# Patient Record
Sex: Male | Born: 1970
Health system: Southern US, Community
[De-identification: ages and names within clinical notes are randomized; demographics above are authoritative.]

## PROBLEM LIST (undated history)

## (undated) DIAGNOSIS — M7710 Lateral epicondylitis, unspecified elbow: Secondary | ICD-10-CM

## (undated) DIAGNOSIS — L309 Dermatitis, unspecified: Secondary | ICD-10-CM

## (undated) DIAGNOSIS — I10 Essential (primary) hypertension: Secondary | ICD-10-CM

## (undated) DIAGNOSIS — E785 Hyperlipidemia, unspecified: Secondary | ICD-10-CM

## (undated) HISTORY — DX: Essential (primary) hypertension: I10

## (undated) HISTORY — PX: WISDOM TOOTH EXTRACTION: SHX21

## (undated) HISTORY — DX: Dermatitis, unspecified: L30.9

## (undated) HISTORY — PX: ROTATOR CUFF REPAIR: SHX139

## (undated) HISTORY — DX: Hyperlipidemia, unspecified: E78.5

## (undated) HISTORY — DX: Lateral epicondylitis, unspecified elbow: M77.10

---

## 2008-11-22 HISTORY — PX: GANGLION CYST EXCISION: SHX1691

## 2011-07-31 ENCOUNTER — Ambulatory Visit (INDEPENDENT_AMBULATORY_CARE_PROVIDER_SITE_OTHER): Payer: 59

## 2011-07-31 DIAGNOSIS — Z23 Encounter for immunization: Secondary | ICD-10-CM

## 2012-06-10 ENCOUNTER — Other Ambulatory Visit: Payer: Self-pay | Admitting: Internal Medicine

## 2012-06-10 NOTE — Telephone Encounter (Signed)
Refill on lisinopril HCTZ 20-12.5 mg. Call into [facility] [address].

## 2012-06-11 MED ORDER — LISINOPRIL-HYDROCHLOROTHIAZIDE 20-12.5 MG PO TABS: 1.0000 | ORAL_TABLET | Freq: Every day | ORAL | Status: DC

## 2012-06-11 NOTE — Telephone Encounter (Signed)
Refilled patients lisinopril per Dr. Lorin Picket

## 2012-06-11 NOTE — Telephone Encounter (Signed)
2nd refill request [facility] [address] Lisinopril hctz 20/12.5mg  tablets  Appointment 2/6

## 2012-06-28 ENCOUNTER — Telehealth: Payer: Self-pay | Admitting: General Practice

## 2012-06-28 MED ORDER — TESTOSTERONE 50 MG/5GM (1%) TD GEL: 5.0000 g | Freq: Every day | TRANSDERMAL | Status: DC

## 2012-06-28 NOTE — Telephone Encounter (Signed)
Ok to refill androgel x 3 months.  Will need to call pharmacy and get exact rx.

## 2012-06-28 NOTE — Telephone Encounter (Signed)
Called prescription in to pharmacy 

## 2012-06-28 NOTE — Telephone Encounter (Signed)
Pt needs androgel 1% at 50mg  refilled. Pt uses [facility] on [address], [place].

## 2012-08-26 ENCOUNTER — Ambulatory Visit: Payer: Self-pay | Admitting: Internal Medicine

## 2012-09-23 ENCOUNTER — Encounter: Payer: Self-pay | Admitting: Internal Medicine

## 2012-09-23 ENCOUNTER — Ambulatory Visit (INDEPENDENT_AMBULATORY_CARE_PROVIDER_SITE_OTHER): Payer: BC Managed Care – PPO | Admitting: Internal Medicine

## 2012-09-23 VITALS — BP 120/60 | HR 73 | Temp 98.4°F | Ht 73.0 in | Wt 183.5 lb

## 2012-09-23 DIAGNOSIS — E291 Testicular hypofunction: Secondary | ICD-10-CM

## 2012-09-23 DIAGNOSIS — I10 Essential (primary) hypertension: Secondary | ICD-10-CM

## 2012-09-23 DIAGNOSIS — E7800 Pure hypercholesterolemia, unspecified: Secondary | ICD-10-CM

## 2012-09-23 DIAGNOSIS — Z720 Tobacco use: Secondary | ICD-10-CM

## 2012-09-23 DIAGNOSIS — D649 Anemia, unspecified: Secondary | ICD-10-CM

## 2012-09-23 DIAGNOSIS — R7989 Other specified abnormal findings of blood chemistry: Secondary | ICD-10-CM

## 2012-09-23 DIAGNOSIS — F17200 Nicotine dependence, unspecified, uncomplicated: Secondary | ICD-10-CM

## 2012-09-23 LAB — CBC WITH DIFFERENTIAL/PLATELET
Basophils Absolute: 0.1 10*3/uL (ref 0.0–0.1)
Eosinophils Absolute: 0.1 10*3/uL (ref 0.0–0.7)
HCT: 40.1 % (ref 39.0–52.0)
Lymphs Abs: 2.3 10*3/uL (ref 0.7–4.0)
MCHC: 34.1 g/dL (ref 30.0–36.0)
MCV: 89.3 fl (ref 78.0–100.0)
Monocytes Absolute: 0.8 10*3/uL (ref 0.1–1.0)
Platelets: 322 10*3/uL (ref 150.0–400.0)
RDW: 14.7 % — ABNORMAL HIGH (ref 11.5–14.6)

## 2012-09-23 LAB — HEPATIC FUNCTION PANEL
ALT: 14 U/L (ref 0–53)
AST: 18 U/L (ref 0–37)
Albumin: 4.2 g/dL (ref 3.5–5.2)
Alkaline Phosphatase: 68 U/L (ref 39–117)

## 2012-09-23 LAB — BASIC METABOLIC PANEL
BUN: 13 mg/dL (ref 6–23)
Chloride: 100 mEq/L (ref 96–112)
Glucose, Bld: 75 mg/dL (ref 70–99)
Potassium: 4.8 mEq/L (ref 3.5–5.1)
Sodium: 136 mEq/L (ref 135–145)

## 2012-09-23 LAB — LIPID PANEL: Cholesterol: 190 mg/dL (ref 0–200)

## 2012-09-23 LAB — PSA: PSA: 1.32 ng/mL (ref 0.10–4.00)

## 2012-09-25 ENCOUNTER — Encounter: Payer: Self-pay | Admitting: Internal Medicine

## 2012-09-25 DIAGNOSIS — E7800 Pure hypercholesterolemia, unspecified: Secondary | ICD-10-CM | POA: Insufficient documentation

## 2012-09-25 DIAGNOSIS — R7989 Other specified abnormal findings of blood chemistry: Secondary | ICD-10-CM | POA: Insufficient documentation

## 2012-09-25 DIAGNOSIS — I10 Essential (primary) hypertension: Secondary | ICD-10-CM | POA: Insufficient documentation

## 2012-09-25 DIAGNOSIS — Z720 Tobacco use: Secondary | ICD-10-CM | POA: Insufficient documentation

## 2012-09-25 DIAGNOSIS — Z862 Personal history of diseases of the blood and blood-forming organs and certain disorders involving the immune mechanism: Secondary | ICD-10-CM | POA: Insufficient documentation

## 2012-09-25 NOTE — Assessment & Plan Note (Signed)
Remains on androgel.  Check psa today.

## 2012-09-25 NOTE — Assessment & Plan Note (Signed)
Discussed the need for smoking cessation.  Discussed treatment options.

## 2012-09-25 NOTE — Progress Notes (Signed)
  Subjective:    Patient ID: Alejandro Ayers, male    DOB: 1971/05/20, 42 y.o.   MRN: [id number]  HPI 42 year old male with past history of hypertension and hypercholesterolemia who comes in today for a scheduled follow up.  He states he is doing well.  Going to school and working.  New job.  Enjoying his new job.  Works a lot of hours.  Tired, but relates it to working and going to school.  No chest pain or tightness.  Breathing stable.  Continues to smoke.  Discussed the need to quit.    Past Medical History  Diagnosis Date  . Hypertension   . Hyperlipidemia   . Eczema     Current Outpatient Prescriptions on File Prior to Visit  Medication Sig Dispense Refill  . lisinopril-hydrochlorothiazide (ZESTORETIC) 20-12.5 MG per tablet Take 1 tablet by mouth daily.  90 tablet  3  . testosterone (ANDROGEL) 50 MG/5GM GEL Place 5 g onto the skin daily.  5 g  3   No current facility-administered medications on file prior to visit.    Review of Systems Patient denies any headache, lightheadedness or dizziness. No sinus or allergy symptom.   No chest pain, tightness or palpitations.  No increased shortness of breath, cough or congestion.  No nausea or vomiting.  No acid reflux.  No abdominal pain or cramping.  No bowel change, such as diarrhea, constipation, BRBPR or melana.  No urine change.        Objective:   Physical Exam Filed Vitals:   09/23/12 1116  BP: 120/60  Pulse: 73  Temp: 98.4 F (36.6 C)   42 year old male in no acute distress.  HEENT:  Nares - clear.  Oropharynx - without lesions. NECK:  Supple.  Nontender.  No audible carotid bruit.  HEART:  Appears to be regular.   LUNGS:  No crackles or wheezing audible.  Respirations even and unlabored.   RADIAL PULSE:  Equal bilaterally.  ABDOMEN:  Soft.  Nontender.  Bowel sounds present and normal.  No audible abdominal bruit.   EXTREMITIES:  No increased edema present.  DP pulses palpable and equal bilaterally.          Assessment  & Plan:  FATIGUE.  Probably related to working and going to school.  Will check cbc,met c and tsh if not checked recently.    CARDIOVASCULAR.  Asymptomatic.  Continue risk factor modification.   HEALTH MAINTENANCE.  Physical 10/06/11.  Check psa today.  Follow cholesterol.

## 2012-09-25 NOTE — Assessment & Plan Note (Signed)
Continue on pravastatin.  Check lipid panel and liver function.  Low cholesterol diet and exercise.

## 2012-09-25 NOTE — Assessment & Plan Note (Signed)
Follow cbc.  

## 2012-09-25 NOTE — Assessment & Plan Note (Signed)
Blood pressure is under good control. Same medication regimen.  Check metabolic panel.  

## 2012-09-27 ENCOUNTER — Ambulatory Visit: Payer: Self-pay | Admitting: Internal Medicine

## 2012-09-29 NOTE — Addendum Note (Signed)
Addended by: Marlene Lard on: 09/29/2012 02:50 PM   Modules accepted: Orders

## 2012-11-02 ENCOUNTER — Encounter: Payer: Self-pay | Admitting: Internal Medicine

## 2012-11-03 ENCOUNTER — Telehealth: Payer: Self-pay | Admitting: Internal Medicine

## 2012-11-03 MED ORDER — PRAVASTATIN SODIUM 20 MG PO TABS: 20.0000 mg | ORAL_TABLET | Freq: Every day | ORAL | Status: DC

## 2012-11-03 NOTE — Telephone Encounter (Signed)
rx sent if for pravastatin 20mg  (#90 with one refill)

## 2012-11-23 ENCOUNTER — Telehealth: Payer: Self-pay | Admitting: Internal Medicine

## 2012-11-23 ENCOUNTER — Ambulatory Visit: Payer: Self-pay | Admitting: Internal Medicine

## 2012-11-23 NOTE — Telephone Encounter (Signed)
Pt came in this morning for his appointment at 8am.  This appointment had been canceled 08/25/12 @ 8:28 am  With reason provider.  He has an appointment 7/21 @ 8:30 for cpx.   Alejandro Ayers Alejandro Ayers wife  Called very upset.  She wanted to know the exact reason why this appointment was canceled. I was unable to give her a reason why his appointment had been cancel  She stated you called her personally to give her this appointment date and time.  Pt spouse is not very happy with the service she is receiving here.  She stated it took her 2 weeks to get a rx refill the last time.

## 2012-11-23 NOTE — Telephone Encounter (Signed)
Called and left message on pts cell phone regarding her concern.  Explained to her that I would try to work him in somewhere else if the 7/14 appt was not acceptable.  Instructed her to check his schedule and have one of them call back with dates.

## 2012-11-24 ENCOUNTER — Telehealth: Payer: Self-pay | Admitting: Internal Medicine

## 2012-11-24 NOTE — Telephone Encounter (Signed)
I can see him on 11/29/12 at 9:30 for a physical.  Please let them know.  Thanks.

## 2012-11-24 NOTE — Telephone Encounter (Signed)
Inetta Fermo is traveling this week and unavailable.  Please contact Billyjack directly.   States unless you can work him in between now and 5/13 between 8am-12pm he will not be able to come.  States she appreciates you calling her.

## 2012-11-25 NOTE — Telephone Encounter (Signed)
Left message for pt to call office  Please confirm 5/12 appointment with pt

## 2012-11-25 NOTE — Telephone Encounter (Signed)
Pt aware of appointment 

## 2012-11-29 ENCOUNTER — Encounter: Payer: Self-pay | Admitting: Internal Medicine

## 2012-11-29 ENCOUNTER — Ambulatory Visit (INDEPENDENT_AMBULATORY_CARE_PROVIDER_SITE_OTHER): Payer: BC Managed Care – PPO | Admitting: Internal Medicine

## 2012-11-29 VITALS — BP 104/70 | HR 72 | Temp 98.1°F | Ht 72.0 in | Wt 176.5 lb

## 2012-11-29 DIAGNOSIS — D649 Anemia, unspecified: Secondary | ICD-10-CM

## 2012-11-29 DIAGNOSIS — I10 Essential (primary) hypertension: Secondary | ICD-10-CM

## 2012-11-29 DIAGNOSIS — F17200 Nicotine dependence, unspecified, uncomplicated: Secondary | ICD-10-CM

## 2012-11-29 DIAGNOSIS — E291 Testicular hypofunction: Secondary | ICD-10-CM

## 2012-11-29 DIAGNOSIS — R7989 Other specified abnormal findings of blood chemistry: Secondary | ICD-10-CM

## 2012-11-29 DIAGNOSIS — E7800 Pure hypercholesterolemia, unspecified: Secondary | ICD-10-CM

## 2012-11-29 DIAGNOSIS — Z720 Tobacco use: Secondary | ICD-10-CM

## 2012-11-29 NOTE — Progress Notes (Signed)
  Subjective:    Patient ID: Alejandro Ayers, male    DOB: 1970/11/22, 42 y.o.   MRN: [id number]  HPI 42 year old male with past history of hypertension and hypercholesterolemia who comes in today to follow up on these issue as well as for a complete physical exam.  He states he is doing well.  Going to school and working.  New job.  Enjoying his new job.  Works a lot of hours.  Tired, but relates it to working and going to school. Feels good.  Enjoying what he does.   No chest pain or tightness.  Breathing stable.  Continues to smoke.  Discussed the need to quit.  Has cut down some    Past Medical History  Diagnosis Date  . Hypertension   . Hyperlipidemia   . Eczema     Current Outpatient Prescriptions on File Prior to Visit  Medication Sig Dispense Refill  . lisinopril-hydrochlorothiazide (ZESTORETIC) 20-12.5 MG per tablet Take 1 tablet by mouth daily.  90 tablet  3  . pravastatin (PRAVACHOL) 20 MG tablet Take 1 tablet (20 mg total) by mouth daily.  90 tablet  1  . testosterone (ANDROGEL) 50 MG/5GM GEL Place 5 g onto the skin daily.  5 g  3   No current facility-administered medications on file prior to visit.    Review of Systems Patient denies any headache, lightheadedness or dizziness. No sinus or allergy symptom.   No chest pain, tightness or palpitations.  No increased shortness of breath, cough or congestion.  No nausea or vomiting.  No acid reflux.  No abdominal pain or cramping.  No bowel change, such as diarrhea, constipation, BRBPR or melana.  No urine change.        Objective:   Physical Exam  Filed Vitals:   11/29/12 0952  BP: 104/70  Pulse: 72  Temp: 98.1 F (36.7 C)   Blood pressure recheck:  118/74, pulse 88  42 year old male in no acute distress.  HEENT:  Nares - clear.  Oropharynx - without lesions. NECK:  Supple.  Nontender.  No audible carotid bruit.  HEART:  Appears to be regular.   LUNGS:  No crackles or wheezing audible.  Respirations even and  unlabored.   RADIAL PULSE:  Equal bilaterally.  ABDOMEN:  Soft.  Nontender.  Bowel sounds present and normal.  No audible abdominal bruit.  GU:  Normal descended testicles.  No palpable testicular nodules.   RECTAL:  Could not appreciate any palpable prostate nodules.  Heme negative.   EXTREMITIES:  No increased edema present.  DP pulses palpable and equal bilaterally.          Assessment & Plan:  CARDIOVASCULAR.  Asymptomatic.  Continue risk factor modification.   HEALTH MAINTENANCE.  Physical today.  PSA 09/23/12 - 1.32.    Follow cholesterol.

## 2012-11-29 NOTE — Assessment & Plan Note (Signed)
Blood pressure is under good control. Same medication regimen.  Follow metabolic panel.    

## 2012-11-29 NOTE — Assessment & Plan Note (Signed)
Follow cbc.  

## 2012-11-29 NOTE — Assessment & Plan Note (Signed)
Remains on androgel.  PSA as outlined.

## 2012-11-29 NOTE — Assessment & Plan Note (Signed)
Continue on pravastatin.  On 20mg  q day now.  Follow lipid panel and liver function.  Low cholesterol diet and exercise.

## 2012-11-29 NOTE — Assessment & Plan Note (Signed)
Discussed the need for smoking cessation.  Discussed treatment options.

## 2013-01-31 ENCOUNTER — Other Ambulatory Visit: Payer: BC Managed Care – PPO

## 2013-01-31 ENCOUNTER — Other Ambulatory Visit (INDEPENDENT_AMBULATORY_CARE_PROVIDER_SITE_OTHER): Payer: BC Managed Care – PPO

## 2013-01-31 DIAGNOSIS — E7800 Pure hypercholesterolemia, unspecified: Secondary | ICD-10-CM

## 2013-01-31 DIAGNOSIS — I10 Essential (primary) hypertension: Secondary | ICD-10-CM

## 2013-01-31 LAB — HEPATIC FUNCTION PANEL
ALT: 17 U/L (ref 0–53)
AST: 16 U/L (ref 0–37)
Albumin: 4.7 g/dL (ref 3.5–5.2)
Alkaline Phosphatase: 73 U/L (ref 39–117)
Bilirubin, Direct: 0.1 mg/dL (ref 0.0–0.3)
Total Bilirubin: 0.5 mg/dL (ref 0.3–1.2)
Total Protein: 7.5 g/dL (ref 6.0–8.3)

## 2013-01-31 LAB — BASIC METABOLIC PANEL
BUN: 22 mg/dL (ref 6–23)
Chloride: 105 mEq/L (ref 96–112)
GFR: 87.13 mL/min (ref >60.00)
Potassium: 4.6 mEq/L (ref 3.5–5.1)
Sodium: 140 mEq/L (ref 135–145)

## 2013-01-31 LAB — LIPID PANEL
Cholesterol: 181 mg/dL (ref 0–200)
HDL: 38 mg/dL — ABNORMAL LOW (ref >39.00)
LDL Cholesterol: 134 mg/dL — ABNORMAL HIGH (ref 0–99)
VLDL: 9.4 mg/dL (ref 0.0–40.0)

## 2013-02-02 ENCOUNTER — Encounter: Payer: Self-pay | Admitting: Internal Medicine

## 2013-02-04 ENCOUNTER — Encounter: Payer: Self-pay | Admitting: *Deleted

## 2013-02-07 ENCOUNTER — Encounter: Payer: BC Managed Care – PPO | Admitting: Internal Medicine

## 2013-05-13 ENCOUNTER — Other Ambulatory Visit: Payer: Self-pay | Admitting: Internal Medicine

## 2013-05-26 ENCOUNTER — Other Ambulatory Visit: Payer: Self-pay

## 2013-06-02 ENCOUNTER — Ambulatory Visit: Payer: BC Managed Care – PPO | Admitting: Internal Medicine

## 2013-07-04 ENCOUNTER — Encounter: Payer: Self-pay | Admitting: Internal Medicine

## 2013-07-04 ENCOUNTER — Other Ambulatory Visit: Payer: Self-pay | Admitting: Internal Medicine

## 2013-07-04 ENCOUNTER — Ambulatory Visit (INDEPENDENT_AMBULATORY_CARE_PROVIDER_SITE_OTHER): Payer: BC Managed Care – PPO | Admitting: Internal Medicine

## 2013-07-04 VITALS — BP 130/80 | HR 82 | Temp 98.1°F | Ht 72.0 in

## 2013-07-04 DIAGNOSIS — F17200 Nicotine dependence, unspecified, uncomplicated: Secondary | ICD-10-CM

## 2013-07-04 DIAGNOSIS — D649 Anemia, unspecified: Secondary | ICD-10-CM

## 2013-07-04 DIAGNOSIS — R7989 Other specified abnormal findings of blood chemistry: Secondary | ICD-10-CM

## 2013-07-04 DIAGNOSIS — E7800 Pure hypercholesterolemia, unspecified: Secondary | ICD-10-CM

## 2013-07-04 DIAGNOSIS — I10 Essential (primary) hypertension: Secondary | ICD-10-CM

## 2013-07-04 DIAGNOSIS — E291 Testicular hypofunction: Secondary | ICD-10-CM

## 2013-07-04 DIAGNOSIS — Z720 Tobacco use: Secondary | ICD-10-CM

## 2013-07-04 NOTE — Progress Notes (Signed)
Orders placed for labs

## 2013-07-04 NOTE — Assessment & Plan Note (Signed)
Follow cbc.  

## 2013-07-04 NOTE — Assessment & Plan Note (Signed)
Discussed the need for smoking cessation.  Wants to retry chantix.  Prescription given .  Will get him back in soon to reassess.

## 2013-07-04 NOTE — Assessment & Plan Note (Signed)
Remains on androgel.  PSA as outlined.  Need to recheck psa.  Only uses androgel occasionally.

## 2013-07-04 NOTE — Progress Notes (Signed)
Pre-visit discussion using our clinic review tool. No additional management support is needed unless otherwise documented below in the visit note.  

## 2013-07-04 NOTE — Assessment & Plan Note (Signed)
Continue on pravastatin.  On 20mg  q day now.  Follow lipid panel and liver function.  Low cholesterol diet and exercise.

## 2013-07-04 NOTE — Assessment & Plan Note (Signed)
Blood pressure is under good control. Same medication regimen.  Follow metabolic panel.    

## 2013-07-04 NOTE — Progress Notes (Signed)
  Subjective:    Patient ID: Alejandro Ayers, male    DOB: July 22, 1970, 42 y.o.   MRN: [id number]  HPI 42 year old male with past history of hypertension and hypercholesterolemia who comes in today for a scheduled follow up.   He states he is doing well.  Going to school and working.  Enjoying his job.  Works a lot of hours.  Tired, but relates it to working and going to school.  Fatigue is better.  Feels good.  Enjoying what he does.   No chest pain or tightness.  Breathing stable.  Continues to smoke.  Discussed the need to quit.  Has cut down some.  Ready to start chantix.  Has taken previously and tolerated well.     Past Medical History  Diagnosis Date  . Hypertension   . Hyperlipidemia   . Eczema     Current Outpatient Prescriptions on File Prior to Visit  Medication Sig Dispense Refill  . lisinopril-hydrochlorothiazide (ZESTORETIC) 20-12.5 MG per tablet Take 1 tablet by mouth daily.  90 tablet  3  . pravastatin (PRAVACHOL) 20 MG tablet TAKE 1 TABLET BY MOUTH EVERY DAY  90 tablet  1  . testosterone (ANDROGEL) 50 MG/5GM GEL Place 5 g onto the skin daily.  5 g  3   No current facility-administered medications on file prior to visit.    Review of Systems Patient denies any headache, lightheadedness or dizziness. No sinus or allergy symptom.   No chest pain, tightness or palpitations.  No increased shortness of breath, cough or congestion.  No nausea or vomiting.  No acid reflux.  No abdominal pain or cramping.  No bowel change, such as diarrhea, constipation, BRBPR or melana.  No urine change.  Ready to quit smoking.         Objective:   Physical Exam  Filed Vitals:   07/04/13 0812  BP: 130/80  Pulse: 82  Temp: 98.1 F (36.7 C)   Blood pressure recheck:  112/74, pulse 88  42 year old male in no acute distress.  HEENT:  Nares - clear.  Oropharynx - without lesions. NECK:  Supple.  Nontender.  No audible carotid bruit.  HEART:  Appears to be regular.   LUNGS:  No crackles or  wheezing audible.  Respirations even and unlabored.   RADIAL PULSE:  Equal bilaterally.  ABDOMEN:  Soft.  Nontender.  Bowel sounds present and normal.  No audible abdominal bruit.  EXTREMITIES:  No increased edema present.  DP pulses palpable and equal bilaterally.          Assessment & Plan:  CARDIOVASCULAR.  Asymptomatic.  Continue risk factor modification.   HEALTH MAINTENANCE.  Physical 11/29/12.   PSA 09/23/12 - 1.32.    Follow cholesterol.

## 2013-07-22 ENCOUNTER — Other Ambulatory Visit: Payer: BC Managed Care – PPO

## 2013-07-22 ENCOUNTER — Other Ambulatory Visit (INDEPENDENT_AMBULATORY_CARE_PROVIDER_SITE_OTHER): Payer: BC Managed Care – PPO

## 2013-07-22 DIAGNOSIS — E291 Testicular hypofunction: Secondary | ICD-10-CM

## 2013-07-22 DIAGNOSIS — D649 Anemia, unspecified: Secondary | ICD-10-CM

## 2013-07-22 DIAGNOSIS — I10 Essential (primary) hypertension: Secondary | ICD-10-CM

## 2013-07-22 DIAGNOSIS — E7800 Pure hypercholesterolemia, unspecified: Secondary | ICD-10-CM

## 2013-07-22 DIAGNOSIS — R7989 Other specified abnormal findings of blood chemistry: Secondary | ICD-10-CM

## 2013-07-22 LAB — CBC WITH DIFFERENTIAL/PLATELET
Basophils Absolute: 0.1 10*3/uL (ref 0.0–0.1)
Basophils Relative: 0.7 % (ref 0.0–3.0)
EOS ABS: 0.3 10*3/uL (ref 0.0–0.7)
Eosinophils Relative: 2.7 % (ref 0.0–5.0)
HCT: 41.4 % (ref 39.0–52.0)
Hemoglobin: 14.2 g/dL (ref 13.0–17.0)
Lymphocytes Relative: 28.5 % (ref 12.0–46.0)
Lymphs Abs: 3 10*3/uL (ref 0.7–4.0)
MCHC: 34.2 g/dL (ref 30.0–36.0)
MCV: 92 fl (ref 78.0–100.0)
MONO ABS: 1 10*3/uL (ref 0.1–1.0)
Monocytes Relative: 9.5 % (ref 3.0–12.0)
NEUTROS PCT: 58.6 % (ref 43.0–77.0)
Neutro Abs: 6.1 10*3/uL (ref 1.4–7.7)
Platelets: 310 10*3/uL (ref 150.0–400.0)
RBC: 4.5 Mil/uL (ref 4.22–5.81)
RDW: 14.6 % (ref 11.5–14.6)
WBC: 10.5 10*3/uL (ref 4.5–10.5)

## 2013-07-22 LAB — LIPID PANEL
Cholesterol: 213 mg/dL — ABNORMAL HIGH (ref 0–200)
HDL: 40.3 mg/dL (ref >39.00)
TRIGLYCERIDES: 81 mg/dL (ref 0.0–149.0)
Total CHOL/HDL Ratio: 5
VLDL: 16.2 mg/dL (ref 0.0–40.0)

## 2013-07-22 LAB — HEPATIC FUNCTION PANEL
ALT: 26 U/L (ref 0–53)
AST: 21 U/L (ref 0–37)
Albumin: 4.5 g/dL (ref 3.5–5.2)
Alkaline Phosphatase: 68 U/L (ref 39–117)
BILIRUBIN TOTAL: 0.6 mg/dL (ref 0.3–1.2)
Bilirubin, Direct: 0.1 mg/dL (ref 0.0–0.3)
Total Protein: 6.9 g/dL (ref 6.0–8.3)

## 2013-07-22 LAB — BASIC METABOLIC PANEL
BUN: 22 mg/dL (ref 6–23)
CO2: 29 mEq/L (ref 19–32)
Calcium: 9.2 mg/dL (ref 8.4–10.5)
Chloride: 103 mEq/L (ref 96–112)
Creatinine, Ser: 0.8 mg/dL (ref 0.4–1.5)
GFR: 114.48 mL/min (ref >60.00)
Glucose, Bld: 102 mg/dL — ABNORMAL HIGH (ref 70–99)
POTASSIUM: 4.9 meq/L (ref 3.5–5.1)
Sodium: 137 mEq/L (ref 135–145)

## 2013-07-22 LAB — FERRITIN: Ferritin: 83.6 ng/mL (ref 22.0–322.0)

## 2013-07-22 LAB — TSH: TSH: 1.08 u[IU]/mL (ref 0.35–5.50)

## 2013-07-22 LAB — LDL CHOLESTEROL, DIRECT: Direct LDL: 160.3 mg/dL

## 2013-07-22 LAB — PSA: PSA: 0.55 ng/mL (ref 0.10–4.00)

## 2013-07-24 ENCOUNTER — Encounter: Payer: Self-pay | Admitting: Internal Medicine

## 2013-07-25 MED ORDER — PRAVASTATIN SODIUM 40 MG PO TABS: 40.0000 mg | ORAL_TABLET | Freq: Every day | ORAL | Status: DC

## 2013-07-25 NOTE — Telephone Encounter (Signed)
I changed his pravastatin to 40mg  q day and sent in rx.  Please send him [facility] lipid diet and low cholesterol diet.   Thanks.

## 2013-08-19 ENCOUNTER — Other Ambulatory Visit: Payer: Self-pay | Admitting: Internal Medicine

## 2013-09-06 ENCOUNTER — Ambulatory Visit: Payer: BC Managed Care – PPO | Admitting: Internal Medicine

## 2013-12-21 ENCOUNTER — Other Ambulatory Visit: Payer: Self-pay | Admitting: Internal Medicine

## 2014-01-22 ENCOUNTER — Other Ambulatory Visit: Payer: Self-pay | Admitting: Internal Medicine

## 2014-02-23 ENCOUNTER — Other Ambulatory Visit: Payer: Self-pay | Admitting: Internal Medicine

## 2014-03-04 ENCOUNTER — Other Ambulatory Visit: Payer: Self-pay | Admitting: Internal Medicine

## 2014-03-06 ENCOUNTER — Encounter: Payer: Self-pay | Admitting: Internal Medicine

## 2014-04-01 ENCOUNTER — Other Ambulatory Visit: Payer: Self-pay | Admitting: Internal Medicine

## 2014-04-03 NOTE — Telephone Encounter (Signed)
Sent mychart message on need for appt and labs

## 2014-04-05 NOTE — Telephone Encounter (Signed)
Mailed unread message to pt  

## 2014-04-10 ENCOUNTER — Telehealth: Payer: Self-pay | Admitting: Internal Medicine

## 2014-04-10 NOTE — Telephone Encounter (Signed)
Pt called in and made an appt for November 4th for med refill and was just wondering if needed to be seen any sooner

## 2014-04-11 NOTE — Telephone Encounter (Signed)
Pt notified & appt scheduled. Has enough meds to last until appt date.

## 2014-04-11 NOTE — Telephone Encounter (Signed)
I can see him on 05/02/14 at 11:45 (put in for physical).  Also need to know if he is having any problems and if he has enough medication to get him through to appt.

## 2014-05-02 ENCOUNTER — Telehealth: Payer: Self-pay | Admitting: Internal Medicine

## 2014-05-02 ENCOUNTER — Encounter: Payer: BC Managed Care – PPO | Admitting: Internal Medicine

## 2014-05-02 NOTE — Telephone Encounter (Signed)
Please advise if you prefer that I call him.

## 2014-05-02 NOTE — Telephone Encounter (Signed)
Pt called and stated that he forgot about appt for today. Pt has rescheduled appt. Pt ask that Dr. Nicki Reaper call him.msn

## 2014-05-02 NOTE — Telephone Encounter (Signed)
Pt was rescheduled for 05/24/14 for a 30 minute appt & just wanted to know if he could do his physical on that day also.

## 2014-05-02 NOTE — Telephone Encounter (Signed)
Let him know that I am seeing pts and see if you can get info.  Thanks

## 2014-05-03 NOTE — Telephone Encounter (Signed)
Yes

## 2014-05-03 NOTE — Telephone Encounter (Signed)
Pt.notified

## 2014-05-13 ENCOUNTER — Other Ambulatory Visit: Payer: Self-pay | Admitting: Internal Medicine

## 2014-05-24 ENCOUNTER — Encounter: Payer: Self-pay | Admitting: Internal Medicine

## 2014-05-24 ENCOUNTER — Ambulatory Visit (INDEPENDENT_AMBULATORY_CARE_PROVIDER_SITE_OTHER): Payer: BC Managed Care – PPO | Admitting: Internal Medicine

## 2014-05-24 VITALS — BP 100/60 | HR 83 | Temp 98.3°F | Ht 72.0 in | Wt 183.2 lb

## 2014-05-24 DIAGNOSIS — I10 Essential (primary) hypertension: Secondary | ICD-10-CM

## 2014-05-24 DIAGNOSIS — E291 Testicular hypofunction: Secondary | ICD-10-CM

## 2014-05-24 DIAGNOSIS — D649 Anemia, unspecified: Secondary | ICD-10-CM

## 2014-05-24 DIAGNOSIS — E7800 Pure hypercholesterolemia, unspecified: Secondary | ICD-10-CM

## 2014-05-24 DIAGNOSIS — R7989 Other specified abnormal findings of blood chemistry: Secondary | ICD-10-CM

## 2014-05-24 DIAGNOSIS — Z720 Tobacco use: Secondary | ICD-10-CM

## 2014-05-24 LAB — CBC WITH DIFFERENTIAL/PLATELET
Basophils Absolute: 0 10*3/uL (ref 0.0–0.1)
Basophils Relative: 0.5 % (ref 0.0–3.0)
Eosinophils Absolute: 0.2 10*3/uL (ref 0.0–0.7)
Eosinophils Relative: 2.4 % (ref 0.0–5.0)
HEMATOCRIT: 41.7 % (ref 39.0–52.0)
HEMOGLOBIN: 14.1 g/dL (ref 13.0–17.0)
LYMPHS ABS: 2.7 10*3/uL (ref 0.7–4.0)
Lymphocytes Relative: 28.3 % (ref 12.0–46.0)
MCHC: 33.9 g/dL (ref 30.0–36.0)
MCV: 91.2 fl (ref 78.0–100.0)
MONOS PCT: 10 % (ref 3.0–12.0)
Monocytes Absolute: 1 10*3/uL (ref 0.1–1.0)
Neutro Abs: 5.7 10*3/uL (ref 1.4–7.7)
Neutrophils Relative %: 58.8 % (ref 43.0–77.0)
PLATELETS: 342 10*3/uL (ref 150.0–400.0)
RBC: 4.57 Mil/uL (ref 4.22–5.81)
RDW: 15 % (ref 11.5–15.5)
WBC: 9.6 10*3/uL (ref 4.0–10.5)

## 2014-05-24 LAB — HEPATIC FUNCTION PANEL
ALBUMIN: 4 g/dL (ref 3.5–5.2)
ALK PHOS: 78 U/L (ref 39–117)
ALT: 20 U/L (ref 0–53)
AST: 24 U/L (ref 0–37)
Bilirubin, Direct: 0.1 mg/dL (ref 0.0–0.3)
Total Bilirubin: 0.7 mg/dL (ref 0.2–1.2)
Total Protein: 7.2 g/dL (ref 6.0–8.3)

## 2014-05-24 LAB — BASIC METABOLIC PANEL
BUN: 21 mg/dL (ref 6–23)
CALCIUM: 9.9 mg/dL (ref 8.4–10.5)
CO2: 28 mEq/L (ref 19–32)
CREATININE: 0.9 mg/dL (ref 0.4–1.5)
Chloride: 101 mEq/L (ref 96–112)
GFR: 95.46 mL/min (ref >60.00)
Glucose, Bld: 79 mg/dL (ref 70–99)
Potassium: 4.6 mEq/L (ref 3.5–5.1)
Sodium: 137 mEq/L (ref 135–145)

## 2014-05-24 LAB — LIPID PANEL
Cholesterol: 193 mg/dL (ref 0–200)
HDL: 37.6 mg/dL — ABNORMAL LOW (ref >39.00)
LDL CALC: 141 mg/dL — AB (ref 0–99)
NonHDL: 155.4
Total CHOL/HDL Ratio: 5
Triglycerides: 74 mg/dL (ref 0.0–149.0)
VLDL: 14.8 mg/dL (ref 0.0–40.0)

## 2014-05-24 NOTE — Progress Notes (Signed)
Pre visit review using our clinic review tool, if applicable. No additional management support is needed unless otherwise documented below in the visit note. 

## 2014-05-25 LAB — PSA: PSA: 0.62 ng/mL (ref 0.10–4.00)

## 2014-05-25 LAB — TSH: TSH: 1.25 u[IU]/mL (ref 0.35–4.50)

## 2014-05-26 ENCOUNTER — Encounter: Payer: Self-pay | Admitting: Internal Medicine

## 2014-05-28 ENCOUNTER — Encounter: Payer: Self-pay | Admitting: Internal Medicine

## 2014-05-28 NOTE — Progress Notes (Signed)
  Subjective:    Patient ID: Alejandro Ayers, male    DOB: 25-Jun-1971, 43 y.o.   MRN: [id number]  HPI 43 year old male with past history of hypertension and hypercholesterolemia who comes in today to follow up on these issues as well as for a complete physical exam.  He states he is doing well.  Finished school.  Enjoying his job.  Works a lot of hours.  Tired, but relates it to working.  Recently got a promotion.   Feels good.  Enjoying what he does.   No chest pain or tightness.  Breathing stable.  Continues to smoke.  Discussed the need to quit.  Has cut down some.  Not ready to quit at this time.  Got his flu shot 05/05/14.    Past Medical History  Diagnosis Date  . Hypertension   . Hyperlipidemia   . Eczema     Current Outpatient Prescriptions on File Prior to Visit  Medication Sig Dispense Refill  . lisinopril-hydrochlorothiazide (PRINZIDE,ZESTORETIC) 20-12.5 MG per tablet TAKE 1 TABLET BY MOUTH EVERY DAY 30 tablet 0  . pravastatin (PRAVACHOL) 40 MG tablet TAKE 1 TABLET BY MOUTH EVERY DAY 30 tablet 0   No current facility-administered medications on file prior to visit.    Review of Systems Patient denies any headache, lightheadedness or dizziness. No sinus or allergy symptom.   No chest pain, tightness or palpitations.  No increased shortness of breath, cough or congestion.  No nausea or vomiting.  No acid reflux.  No abdominal pain or cramping.  No bowel change, such as diarrhea, constipation, BRBPR or melana.  No urine change.  Smoking <1ppd.          Objective:   Physical Exam  Filed Vitals:   05/24/14 1105  BP: 100/60  Pulse: 83  Temp: 98.3 F (36.8 C)   Blood pressure recheck:  112/68, pulse 78  42 year old male in no acute distress.  HEENT:  Nares - clear.  Oropharynx - without lesions. NECK:  Supple.  Nontender.  No audible carotid bruit.  HEART:  Appears to be regular.   LUNGS:  No crackles or wheezing audible.  Respirations even and unlabored.   RADIAL PULSE:   Equal bilaterally.  ABDOMEN:  Soft.  Nontender.  Bowel sounds present and normal.  No audible abdominal bruit.  GU:  Normal descended testicles.  No palpable testicular nodules.   RECTAL:  Could not appreciate any palpable prostate nodules.  Heme negative.   EXTREMITIES:  No increased edema present.  DP pulses palpable and equal bilaterally.         Assessment & Plan:  CARDIOVASCULAR.  Asymptomatic.  Continue risk factor modification.   Essential hypertension, benign Blood pressure doing well.  Follow.   - Basic metabolic panel  Low testosterone Off androgel.  Follow.  - PSA  Pure hypercholesterolemia On pravastatin.  Low cholesterol diet and exercise.   - Lipid panel - Hepatic function panel  Anemia, unspecified anemia type - CBC with Differential - TSH  Tobacco abuse Discussed with him today regarding the need to quit.  He is not ready to stop at this point.  Follow.   HEALTH MAINTENANCE.  Physical today.   PSA 07/22/13- .55.    Follow cholesterol.

## 2014-05-29 ENCOUNTER — Other Ambulatory Visit: Payer: Self-pay | Admitting: *Deleted

## 2014-05-29 MED ORDER — ATORVASTATIN CALCIUM 20 MG PO TABS: 20.0000 mg | ORAL_TABLET | Freq: Every day | ORAL | Status: DC

## 2014-06-22 ENCOUNTER — Other Ambulatory Visit: Payer: Self-pay | Admitting: Internal Medicine

## 2014-06-27 ENCOUNTER — Encounter: Payer: BC Managed Care – PPO | Admitting: Internal Medicine

## 2014-07-29 ENCOUNTER — Other Ambulatory Visit: Payer: Self-pay | Admitting: Internal Medicine

## 2014-08-25 ENCOUNTER — Other Ambulatory Visit: Payer: Self-pay | Admitting: *Deleted

## 2014-08-25 MED ORDER — LISINOPRIL-HYDROCHLOROTHIAZIDE 20-12.5 MG PO TABS: 1.0000 | ORAL_TABLET | Freq: Every day | ORAL | Status: DC

## 2014-08-25 MED ORDER — ATORVASTATIN CALCIUM 20 MG PO TABS
20.0000 mg | ORAL_TABLET | Freq: Every day | ORAL | Status: DC
Start: 2014-08-25 — End: 2015-02-18

## 2014-11-22 ENCOUNTER — Ambulatory Visit: Payer: BC Managed Care – PPO | Admitting: Internal Medicine

## 2014-12-07 ENCOUNTER — Ambulatory Visit (INDEPENDENT_AMBULATORY_CARE_PROVIDER_SITE_OTHER): Payer: Managed Care, Other (non HMO) | Admitting: Internal Medicine

## 2014-12-07 ENCOUNTER — Encounter: Payer: Self-pay | Admitting: Internal Medicine

## 2014-12-07 VITALS — BP 110/70 | HR 84 | Temp 98.0°F | Ht 72.0 in | Wt 192.2 lb

## 2014-12-07 DIAGNOSIS — M79605 Pain in left leg: Secondary | ICD-10-CM | POA: Insufficient documentation

## 2014-12-07 DIAGNOSIS — E7800 Pure hypercholesterolemia, unspecified: Secondary | ICD-10-CM

## 2014-12-07 DIAGNOSIS — D649 Anemia, unspecified: Secondary | ICD-10-CM

## 2014-12-07 DIAGNOSIS — Z0000 Encounter for general adult medical examination without abnormal findings: Secondary | ICD-10-CM

## 2014-12-07 DIAGNOSIS — I10 Essential (primary) hypertension: Secondary | ICD-10-CM | POA: Diagnosis not present

## 2014-12-07 DIAGNOSIS — H00019 Hordeolum externum unspecified eye, unspecified eyelid: Secondary | ICD-10-CM

## 2014-12-07 DIAGNOSIS — Z720 Tobacco use: Secondary | ICD-10-CM

## 2014-12-07 NOTE — Progress Notes (Deleted)
   Subjective:  Patient ID: Alejandro Ayers, male    DOB: 23-Jan-1971  Age: 44 y.o. MRN: [id number]  CC: There were no encounter diagnoses.  HPI [name] presents for ***  Outpatient Prescriptions Prior to Visit  Medication Sig Dispense Refill  . atorvastatin (LIPITOR) 20 MG tablet Take 1 tablet (20 mg total) by mouth daily. 90 tablet 1  . lisinopril-hydrochlorothiazide (PRINZIDE,ZESTORETIC) 20-12.5 MG per tablet Take 1 tablet by mouth daily. 90 tablet 1   No facility-administered medications prior to visit.    ROS Review of Systems  Objective:  BP 110/70 mmHg  Pulse 84  Temp(Src) 98 F (36.7 C) (Oral)  Ht 6' (1.829 m)  Wt 192 lb 4 oz (87.204 kg)  BMI 26.07 kg/m2  SpO2 96%  BP Readings from Last 3 Encounters:  12/07/14 110/70  05/24/14 100/60  07/04/13 130/80    Wt Readings from Last 3 Encounters:  12/07/14 192 lb 4 oz (87.204 kg)  05/24/14 183 lb 4 oz (83.122 kg)  11/29/12 176 lb 8 oz (80.06 kg)    Physical Exam  No results found for: HGBA1C  Lab Results  Component Value Date   CREATININE 0.9 05/24/2014   CREATININE 0.8 07/22/2013   CREATININE 1.0 01/31/2013    Lab Results  Component Value Date   WBC 9.6 05/24/2014   HGB 14.1 05/24/2014   HCT 41.7 05/24/2014   PLT 342.0 05/24/2014   GLUCOSE 79 05/24/2014   CHOL 193 05/24/2014   TRIG 74.0 05/24/2014   HDL 37.60* 05/24/2014   LDLDIRECT 160.3 07/22/2013   LDLCALC 141* 05/24/2014   ALT 20 05/24/2014   AST 24 05/24/2014   NA 137 05/24/2014   K 4.6 05/24/2014   CL 101 05/24/2014   CREATININE 0.9 05/24/2014   BUN 21 05/24/2014   CO2 28 05/24/2014   TSH 1.25 05/24/2014   PSA 0.62 05/24/2014    Patient was never admitted.  Assessment & Plan:   Problem List Items Addressed This Visit    None      I am having Mr. Sida maintain his atorvastatin and lisinopril-hydrochlorothiazide.  No orders of the defined types were placed in this encounter.    There are no discontinued  medications.  Follow-up: No Follow-up on file.   Einar Pheasant, MD

## 2014-12-07 NOTE — Progress Notes (Signed)
Pre visit review using our clinic review tool, if applicable. No additional management support is needed unless otherwise documented below in the visit note. 

## 2014-12-18 ENCOUNTER — Encounter: Payer: Self-pay | Admitting: Internal Medicine

## 2014-12-18 DIAGNOSIS — Z0000 Encounter for general adult medical examination without abnormal findings: Secondary | ICD-10-CM | POA: Insufficient documentation

## 2014-12-18 DIAGNOSIS — H00019 Hordeolum externum unspecified eye, unspecified eyelid: Secondary | ICD-10-CM | POA: Insufficient documentation

## 2014-12-18 NOTE — Assessment & Plan Note (Signed)
Follow cbc. Last hgb 14.1.

## 2014-12-18 NOTE — Progress Notes (Signed)
Patient ID: Alejandro Ayers, male   DOB: 1971/07/18, 44 y.o.   MRN: [id number]   Subjective:    Patient ID: Alejandro Ayers, male    DOB: March 01, 1971, 44 y.o.   MRN: [id number]  HPI  Patient here for a scheduled follow up.  Working.  Has finished school.  Woks a lot of hours.  Some fatigue related to this.  Nothing out of the ordinary.  Stays active.  No cardiac symptoms with increased activity or exertion.  Still smoking.  Discussed the need to quit.  Discussed treatment options.  Eating and drinking well.  Bowels stable.  Persistent left leg pain.  Request referral to ortho for evaluation.  Sty present.  Discussed using warm compresses.     Past Medical History  Diagnosis Date  . Hypertension   . Hyperlipidemia   . Eczema     Current Outpatient Prescriptions on File Prior to Visit  Medication Sig Dispense Refill  . atorvastatin (LIPITOR) 20 MG tablet Take 1 tablet (20 mg total) by mouth daily. 90 tablet 1  . lisinopril-hydrochlorothiazide (PRINZIDE,ZESTORETIC) 20-12.5 MG per tablet Take 1 tablet by mouth daily. 90 tablet 1   No current facility-administered medications on file prior to visit.    Review of Systems  Constitutional: Negative for appetite change and unexpected weight change.  HENT: Negative for congestion and sinus pressure.   Respiratory: Negative for cough, chest tightness and shortness of breath.   Cardiovascular: Negative for chest pain, palpitations and leg swelling.  Gastrointestinal: Negative for nausea, vomiting, abdominal pain and diarrhea.  Musculoskeletal:       Persistent left leg pain.    Skin: Negative for color change and rash.  Neurological: Negative for dizziness, light-headedness and headaches.  Psychiatric/Behavioral: Negative for dysphoric mood and agitation.       Objective:    Physical Exam  Constitutional: He appears well-developed and well-nourished. No distress.  HENT:  Nose: Nose normal.  Mouth/Throat: Oropharynx is clear and moist.    Sty - eyelid   Neck: Neck supple. No thyromegaly present.  Cardiovascular: Normal rate and regular rhythm.   Pulmonary/Chest: Effort normal and breath sounds normal. No respiratory distress.  Abdominal: Soft. Bowel sounds are normal. There is no tenderness.  Musculoskeletal: He exhibits no edema or tenderness.  Lymphadenopathy:    He has no cervical adenopathy.  Skin: No rash noted. No erythema.  Psychiatric: He has a normal mood and affect. His behavior is normal.    BP 110/70 mmHg  Pulse 84  Temp(Src) 98 F (36.7 C) (Oral)  Ht 6' (1.829 m)  Wt 192 lb 4 oz (87.204 kg)  BMI 26.07 kg/m2  SpO2 96% Wt Readings from Last 3 Encounters:  12/07/14 192 lb 4 oz (87.204 kg)  05/24/14 183 lb 4 oz (83.122 kg)  11/29/12 176 lb 8 oz (80.06 kg)     Lab Results  Component Value Date   WBC 9.6 05/24/2014   HGB 14.1 05/24/2014   HCT 41.7 05/24/2014   PLT 342.0 05/24/2014   GLUCOSE 79 05/24/2014   CHOL 193 05/24/2014   TRIG 74.0 05/24/2014   HDL 37.60* 05/24/2014   LDLDIRECT 160.3 07/22/2013   LDLCALC 141* 05/24/2014   ALT 20 05/24/2014   AST 24 05/24/2014   NA 137 05/24/2014   K 4.6 05/24/2014   CL 101 05/24/2014   CREATININE 0.9 05/24/2014   BUN 21 05/24/2014   CO2 28 05/24/2014   TSH 1.25 05/24/2014   PSA 0.62 05/24/2014  Assessment & Plan:   Problem List Items Addressed This Visit    Anemia    Follow cbc. Last hgb 14.1.        Essential hypertension, benign    Blood pressure doing well.  Same medication regimen.  Follow pressures.  Follow metabolic panel.        Relevant Orders   Basic metabolic panel   Health care maintenance    Physical 05/24/14.  PSA .62 - 05/24/14.        Left leg pain - Primary    Persistent pain.  Request referral to ortho.  Refer to [facility].        Relevant Orders   Ambulatory referral to Orthopedic Surgery   Pure hypercholesterolemia    On lipitor.  Follow lipid panel and liver function tests.  Low cholesterol diet and  exercise.        Relevant Orders   Lipid panel   Hepatic function panel   Sty    On eyelid.  Warm compresses.  Notify me if persistent.       Tobacco abuse    Discussed the need to quit smoking.  Discussed treatment options.           Einar Pheasant, MD

## 2014-12-18 NOTE — Assessment & Plan Note (Signed)
Discussed the need to quit smoking.  Discussed treatment options.

## 2014-12-18 NOTE — Assessment & Plan Note (Signed)
Physical 05/24/14.  PSA .62 - 05/24/14.

## 2014-12-18 NOTE — Assessment & Plan Note (Signed)
Persistent pain.  Request referral to ortho.  Refer to [facility].

## 2014-12-18 NOTE — Assessment & Plan Note (Signed)
On lipitor.  Follow lipid panel and liver function tests.  Low cholesterol diet and exercise   

## 2014-12-18 NOTE — Assessment & Plan Note (Signed)
Blood pressure doing well.  Same medication regimen.  Follow pressures.  Follow metabolic panel.   

## 2014-12-18 NOTE — Assessment & Plan Note (Signed)
On eyelid.  Warm compresses.  Notify me if persistent.

## 2014-12-25 ENCOUNTER — Other Ambulatory Visit: Payer: Managed Care, Other (non HMO)

## 2015-01-08 ENCOUNTER — Encounter: Payer: Self-pay | Admitting: Internal Medicine

## 2015-01-08 ENCOUNTER — Other Ambulatory Visit (INDEPENDENT_AMBULATORY_CARE_PROVIDER_SITE_OTHER): Payer: Managed Care, Other (non HMO)

## 2015-01-08 DIAGNOSIS — E7800 Pure hypercholesterolemia, unspecified: Secondary | ICD-10-CM

## 2015-01-08 DIAGNOSIS — I10 Essential (primary) hypertension: Secondary | ICD-10-CM

## 2015-01-08 LAB — BASIC METABOLIC PANEL
BUN: 18 mg/dL (ref 6–23)
CHLORIDE: 104 meq/L (ref 96–112)
CO2: 29 mEq/L (ref 19–32)
Calcium: 9.5 mg/dL (ref 8.4–10.5)
Creatinine, Ser: 0.89 mg/dL (ref 0.40–1.50)
GFR: 97.67 mL/min (ref >60.00)
Glucose, Bld: 93 mg/dL (ref 70–99)
POTASSIUM: 4.5 meq/L (ref 3.5–5.1)
Sodium: 138 mEq/L (ref 135–145)

## 2015-01-08 LAB — LIPID PANEL
CHOL/HDL RATIO: 3
Cholesterol: 136 mg/dL (ref 0–200)
HDL: 39.4 mg/dL (ref >39.00)
LDL CALC: 88 mg/dL (ref 0–99)
NONHDL: 96.6
Triglycerides: 41 mg/dL (ref 0.0–149.0)
VLDL: 8.2 mg/dL (ref 0.0–40.0)

## 2015-01-08 LAB — HEPATIC FUNCTION PANEL
ALT: 22 U/L (ref 0–53)
AST: 19 U/L (ref 0–37)
Albumin: 4.4 g/dL (ref 3.5–5.2)
Alkaline Phosphatase: 82 U/L (ref 39–117)
Bilirubin, Direct: 0 mg/dL (ref 0.0–0.3)
TOTAL PROTEIN: 6.7 g/dL (ref 6.0–8.3)
Total Bilirubin: 0.3 mg/dL (ref 0.2–1.2)

## 2015-01-31 ENCOUNTER — Encounter: Payer: Self-pay | Admitting: Internal Medicine

## 2015-02-07 ENCOUNTER — Encounter: Payer: Self-pay | Admitting: Internal Medicine

## 2015-02-18 ENCOUNTER — Other Ambulatory Visit: Payer: Self-pay | Admitting: Internal Medicine

## 2015-04-11 ENCOUNTER — Telehealth: Payer: Self-pay | Admitting: Internal Medicine

## 2015-04-11 ENCOUNTER — Encounter: Payer: Self-pay | Admitting: Internal Medicine

## 2015-04-11 NOTE — Telephone Encounter (Signed)
Called and spoke to pt on the phone regarding this matter.  See phone message.

## 2015-04-11 NOTE — Telephone Encounter (Signed)
Called pt and spoke with him on the phone.  We discussed the situation and the problem with the records request.  I need to make sure that his records get sent.  He told me that you were going to call and arrange for these to be faxed.  Let me know if I need to do something to help facilitate this.

## 2015-04-11 NOTE — Telephone Encounter (Signed)
The patient had signed a records release for his records to be sent to the [place] on 7.19.16. Unfortunately his information was sent to the [facility] for his records to be sent to this office. The patient is upset because his records were not sent to the [place].

## 2015-04-12 NOTE — Telephone Encounter (Signed)
I've called [email or web address] our record processing agency and spoke with Sharyn Lull she will take care of this ASAP.

## 2015-06-11 ENCOUNTER — Ambulatory Visit (INDEPENDENT_AMBULATORY_CARE_PROVIDER_SITE_OTHER): Payer: Managed Care, Other (non HMO) | Admitting: Internal Medicine

## 2015-06-11 ENCOUNTER — Encounter: Payer: Self-pay | Admitting: Internal Medicine

## 2015-06-11 VITALS — BP 120/70 | HR 91 | Temp 98.1°F | Resp 18 | Ht 71.25 in | Wt 194.5 lb

## 2015-06-11 DIAGNOSIS — E7800 Pure hypercholesterolemia, unspecified: Secondary | ICD-10-CM

## 2015-06-11 DIAGNOSIS — M778 Other enthesopathies, not elsewhere classified: Secondary | ICD-10-CM

## 2015-06-11 DIAGNOSIS — Z720 Tobacco use: Secondary | ICD-10-CM

## 2015-06-11 DIAGNOSIS — Z862 Personal history of diseases of the blood and blood-forming organs and certain disorders involving the immune mechanism: Secondary | ICD-10-CM

## 2015-06-11 DIAGNOSIS — I10 Essential (primary) hypertension: Secondary | ICD-10-CM

## 2015-06-11 DIAGNOSIS — M25522 Pain in left elbow: Secondary | ICD-10-CM

## 2015-06-11 DIAGNOSIS — M659 Synovitis and tenosynovitis, unspecified: Secondary | ICD-10-CM

## 2015-06-11 DIAGNOSIS — Z125 Encounter for screening for malignant neoplasm of prostate: Secondary | ICD-10-CM

## 2015-06-11 DIAGNOSIS — Z0000 Encounter for general adult medical examination without abnormal findings: Secondary | ICD-10-CM

## 2015-06-11 NOTE — Progress Notes (Addendum)
Patient ID: Alejandro Ayers, male   DOB: 1971/03/22, 44 y.o.   MRN: [id number]   Subjective:    Patient ID: Alejandro Ayers, male    DOB: November 16, 1970, 44 y.o.   MRN: [id number]  HPI  Patient with past history of hypercholesterolemia and hypertension.  He comes in today to follow up on these issues as well as for a complete physical exam.  He continues to smoke and has for the last 30 years.  Smokes 1ppd.  Over the last 2-3 months, he has noticed increased pain in her right elbow.  Hurts worse when he lifts something or twist his forearm.  Also injured his left elbow.  This has been 6-8 months ago.  Still pain with certain positions - pressure on the area - when driving.  Stays active.  No cardiac symptoms with increased activity or exertion.  No sob.  No acid reflux reported.  No abdominal pain or cramping.  Bowels stable.  Working.     Past Medical History  Diagnosis Date  . Hypertension   . Hyperlipidemia   . Eczema    Past Surgical History  Procedure Laterality Date  . Ganglion cyst excision  11/22/08   Family History  Problem Relation Age of Onset  . CVA Sister     after head injury  . Prostate cancer Neg Hx   . Colon cancer Neg Hx    Social History   Social History  . Marital Status: Married    Spouse Name: N/A  . Number of Children: N/A  . Years of Education: N/A   Social History Main Topics  . Smoking status: Current Every Day Smoker -- 1.00 packs/day    Types: Cigarettes  . Smokeless tobacco: Never Used  . Alcohol Use: 0.0 oz/week    0 Standard drinks or equivalent per week  . Drug Use: No  . Sexual Activity: Not Asked   Other Topics Concern  . None   Social History Narrative    Outpatient Encounter Prescriptions as of 06/11/2015  Medication Sig  . atorvastatin (LIPITOR) 20 MG tablet TAKE 1 TABLET DAILY  . lisinopril-hydrochlorothiazide (PRINZIDE,ZESTORETIC) 20-12.5 MG per tablet TAKE 1 TABLET DAILY   No facility-administered encounter medications on file as  of 06/11/2015.    Review of Systems  Constitutional: Negative for appetite change and unexpected weight change.  HENT: Negative for congestion and sinus pressure.   Eyes: Negative for pain and visual disturbance.  Respiratory: Negative for cough, chest tightness and shortness of breath.   Cardiovascular: Negative for chest pain, palpitations and leg swelling.  Gastrointestinal: Negative for nausea, vomiting, abdominal pain and diarrhea.  Genitourinary: Negative for dysuria and difficulty urinating.  Musculoskeletal: Negative for back pain and joint swelling.       Increased pain in her right and left elbow as outlined.   Skin: Negative for color change and rash.  Neurological: Negative for dizziness, light-headedness and headaches.  Hematological: Negative for adenopathy. Does not bruise/bleed easily.  Psychiatric/Behavioral: Negative for dysphoric mood and agitation.       Objective:     Blood pressure rechecked by me:  120/78  Physical Exam  Constitutional: He is oriented to person, place, and time. He appears well-developed and well-nourished. No distress.  HENT:  Head: Normocephalic and atraumatic.  Nose: Nose normal.  Mouth/Throat: Oropharynx is clear and moist. No oropharyngeal exudate.  Eyes: Conjunctivae are normal. Right eye exhibits no discharge. Left eye exhibits no discharge.  Neck: Neck supple. No thyromegaly present.  Cardiovascular: Normal rate and regular rhythm.   Pulmonary/Chest: Breath sounds normal. No respiratory distress. He has no wheezes.  Abdominal: Soft. Bowel sounds are normal. There is no tenderness.  Genitourinary:  Rectal exam - no palpable prostate nodules.  Heme negative.    Musculoskeletal: He exhibits no edema or tenderness.  Increased pain to palpation over the left elbow.  Increased pain with rotation of her right forearm - appears to be c/w tendinitis.    Lymphadenopathy:    He has no cervical adenopathy.  Neurological: He is alert and  oriented to person, place, and time.  Skin: Skin is warm and dry. No rash noted.  Psychiatric: He has a normal mood and affect. His behavior is normal.    BP 120/70 mmHg  Pulse 91  Temp(Src) 98.1 F (36.7 C) (Oral)  Resp 18  Ht 5' 11.25" (1.81 m)  Wt 194 lb 8 oz (88.225 kg)  BMI 26.93 kg/m2  SpO2 97% Wt Readings from Last 3 Encounters:  06/11/15 194 lb 8 oz (88.225 kg)  12/07/14 192 lb 4 oz (87.204 kg)  05/24/14 183 lb 4 oz (83.122 kg)     Lab Results  Component Value Date   WBC 9.6 05/24/2014   HGB 14.1 05/24/2014   HCT 41.7 05/24/2014   PLT 342.0 05/24/2014   GLUCOSE 93 01/08/2015   CHOL 136 01/08/2015   TRIG 41.0 01/08/2015   HDL 39.40 01/08/2015   LDLDIRECT 160.3 07/22/2013   LDLCALC 88 01/08/2015   ALT 22 01/08/2015   AST 19 01/08/2015   NA 138 01/08/2015   K 4.5 01/08/2015   CL 104 01/08/2015   CREATININE 0.89 01/08/2015   BUN 18 01/08/2015   CO2 29 01/08/2015   TSH 1.25 05/24/2014   PSA 0.62 05/24/2014       Assessment & Plan:   Problem List Items Addressed This Visit    Essential hypertension, benign - Primary    Blood pressure under good control.  Continue same medication regimen.  Follow pressures.  Follow metabolic panel.        Relevant Orders   CBC with Differential/Platelet   Basic metabolic panel   Health care maintenance    Physical today 06/11/15.  Check psa with next labs.        History of anemia    Recheck cbc today.       Left elbow pain    Increased and persistent pain s/p injury.  Check xray.  Further treatment pending results.        Relevant Orders   DG Elbow 2 Views Left   Pure hypercholesterolemia    Low cholesterol diet and exercise.  Follow lipid panel and liver function tests.  On lipitor.       Relevant Orders   TSH   Lipid panel   Hepatic function panel   Right elbow tendinitis    Exam c/w tendinitis.  Elbow strap.  Gentle use of antiinflammatories.  Update me over the next 1-2 weeks.        Tobacco  abuse    Discussed stopping smoking.  Discussed the need to quit.  He will notify me if/when agreeable.  Will check and see if insurance covers low dose CT.        Other Visit Diagnoses    Prostate cancer screening        Relevant Orders    PSA        Einar Pheasant, MD

## 2015-06-11 NOTE — Progress Notes (Signed)
Pre-visit discussion using our clinic review tool. No additional management support is needed unless otherwise documented below in the visit note.  

## 2015-06-12 ENCOUNTER — Encounter: Payer: Self-pay | Admitting: Internal Medicine

## 2015-06-12 ENCOUNTER — Telehealth: Payer: Self-pay | Admitting: Internal Medicine

## 2015-06-12 DIAGNOSIS — M778 Other enthesopathies, not elsewhere classified: Secondary | ICD-10-CM | POA: Insufficient documentation

## 2015-06-12 DIAGNOSIS — M25522 Pain in left elbow: Secondary | ICD-10-CM | POA: Insufficient documentation

## 2015-06-12 NOTE — Assessment & Plan Note (Signed)
Low cholesterol diet and exercise.  Follow lipid panel and liver function tests.  On lipitor.   

## 2015-06-12 NOTE — Addendum Note (Signed)
Addended by: Alisa Graff on: 06/12/2015 05:46 AM   Modules accepted: Miquel Dunn

## 2015-06-12 NOTE — Assessment & Plan Note (Signed)
Discussed stopping smoking.  Discussed the need to quit.  He will notify me if/when agreeable.  Will check and see if insurance covers low dose CT.

## 2015-06-12 NOTE — Telephone Encounter (Signed)
Pt notified xray ordered at [facility].

## 2015-06-12 NOTE — Assessment & Plan Note (Signed)
Increased and persistent pain s/p injury.  Check xray.  Further treatment pending results.

## 2015-06-12 NOTE — Assessment & Plan Note (Signed)
Recheck cbc today.   

## 2015-06-12 NOTE — Assessment & Plan Note (Signed)
Exam c/w tendinitis.  Elbow strap.  Gentle use of antiinflammatories.  Update me over the next 1-2 weeks.

## 2015-06-12 NOTE — Assessment & Plan Note (Signed)
Blood pressure under good control.  Continue same medication regimen.  Follow pressures.  Follow metabolic panel.   

## 2015-06-12 NOTE — Assessment & Plan Note (Signed)
Physical today 06/11/15.  Check psa with next labs.

## 2015-07-27 ENCOUNTER — Other Ambulatory Visit: Payer: Self-pay | Admitting: Internal Medicine

## 2015-10-22 ENCOUNTER — Telehealth: Payer: Self-pay | Admitting: Family Medicine

## 2015-10-22 NOTE — Telephone Encounter (Signed)
Pt scheduled for 12/1215 @ 3:00

## 2015-10-22 NOTE — Telephone Encounter (Signed)
Ok to establish 

## 2015-10-22 NOTE — Telephone Encounter (Signed)
Pt would like to trans care to Dr Birdie Riddle, please advise if ok to schedule.

## 2015-12-10 ENCOUNTER — Ambulatory Visit: Payer: Managed Care, Other (non HMO) | Admitting: Internal Medicine

## 2015-12-13 ENCOUNTER — Ambulatory Visit: Payer: Managed Care, Other (non HMO) | Admitting: Internal Medicine

## 2015-12-31 ENCOUNTER — Ambulatory Visit (INDEPENDENT_AMBULATORY_CARE_PROVIDER_SITE_OTHER): Payer: Managed Care, Other (non HMO) | Admitting: Family Medicine

## 2015-12-31 ENCOUNTER — Encounter: Payer: Self-pay | Admitting: Family Medicine

## 2015-12-31 VITALS — BP 118/68 | HR 83 | Temp 98.0°F | Resp 16 | Ht 71.0 in | Wt 191.0 lb

## 2015-12-31 DIAGNOSIS — E291 Testicular hypofunction: Secondary | ICD-10-CM

## 2015-12-31 DIAGNOSIS — I10 Essential (primary) hypertension: Secondary | ICD-10-CM | POA: Diagnosis not present

## 2015-12-31 DIAGNOSIS — Z720 Tobacco use: Secondary | ICD-10-CM

## 2015-12-31 DIAGNOSIS — E7800 Pure hypercholesterolemia, unspecified: Secondary | ICD-10-CM | POA: Diagnosis not present

## 2015-12-31 DIAGNOSIS — R7989 Other specified abnormal findings of blood chemistry: Secondary | ICD-10-CM

## 2015-12-31 DIAGNOSIS — Z23 Encounter for immunization: Secondary | ICD-10-CM

## 2015-12-31 NOTE — Assessment & Plan Note (Signed)
New to provider, ongoing for pt.  Tolerating statin w/o difficulty.  No formal exercise- encouraged this.  Check labs.  Adjust meds prn

## 2015-12-31 NOTE — Progress Notes (Signed)
Pre visit review using our clinic review tool, if applicable. No additional management support is needed unless otherwise documented below in the visit note. 

## 2015-12-31 NOTE — Assessment & Plan Note (Signed)
New to provider, pt has hx of similar.  Previously treated w/ Androgel but had skin reaction.  Pt continues to have sxs and would be interested in treating w/ an Androgel alternative.  Check levels and tx prn.

## 2015-12-31 NOTE — Progress Notes (Signed)
   Subjective:    Patient ID: Alejandro Ayers, male    DOB: 1970/11/04, 45 y.o.   MRN: [id number]  HPI  New to establish.  Previous MD- Nicki Reaper  HTN- chronic problem, on Lisinopril HCTZ daily.  No CP, SOB, HAs, visual changes, edema.  Hyperlipidemia- chronic problem, on Lipitor daily.  No abd pain, N/V, myalgias.  No formal exercise.  Low T- pt's testosterone was low 2013 but I do not see a more recent result.  Pt previously used Androgel and due to skin redness stopped using medication after 6-8 months.  Ongoing issues w/ fatigue.  Pt states that if there was another option, he would consider treatment.    Tobacco use- chronic problem, smoking 1 ppd.   Review of Systems For ROS see HPI     Objective:   Physical Exam  Constitutional: He is oriented to person, place, and time. He appears well-developed and well-nourished. No distress.  HENT:  Head: Normocephalic and atraumatic.  Eyes: Conjunctivae and EOM are normal. Pupils are equal, round, and reactive to light.  Neck: Normal range of motion. Neck supple. No thyromegaly present.  Cardiovascular: Normal rate, regular rhythm, normal heart sounds and intact distal pulses.   No murmur heard. Pulmonary/Chest: Effort normal and breath sounds normal. No respiratory distress.  Abdominal: Soft. Bowel sounds are normal. He exhibits no distension.  Musculoskeletal: He exhibits no edema.  Lymphadenopathy:    He has no cervical adenopathy.  Neurological: He is alert and oriented to person, place, and time. No cranial nerve deficit.  Skin: Skin is warm and dry.  Psychiatric: He has a normal mood and affect. His behavior is normal.  Vitals reviewed.         Assessment & Plan:

## 2015-12-31 NOTE — Patient Instructions (Signed)
Schedule your lab appt for Wednesday morning Schedule your physical for November/December We'll notify you of your lab results and make any changes if needed Continue to work on healthy diet and regular exercise Cut back on smoking- you can do it!! Call with any questions or concerns Welcome!  We're glad to have you!!!

## 2015-12-31 NOTE — Assessment & Plan Note (Signed)
New to provider, ongoing for pt.  Encouraged him to stop smoking.  He is aware he needs to but is not interested at this time.  Will continue to follow.

## 2015-12-31 NOTE — Assessment & Plan Note (Signed)
New to provider, ongoing for pt.  Well controlled today.  Asymptomatic.  Check labs.  No anticipated med changes.

## 2016-01-02 ENCOUNTER — Other Ambulatory Visit (INDEPENDENT_AMBULATORY_CARE_PROVIDER_SITE_OTHER): Payer: Managed Care, Other (non HMO)

## 2016-01-02 DIAGNOSIS — E7800 Pure hypercholesterolemia, unspecified: Secondary | ICD-10-CM

## 2016-01-02 DIAGNOSIS — R7989 Other specified abnormal findings of blood chemistry: Secondary | ICD-10-CM

## 2016-01-02 DIAGNOSIS — I10 Essential (primary) hypertension: Secondary | ICD-10-CM | POA: Diagnosis not present

## 2016-01-02 LAB — CBC WITH DIFFERENTIAL/PLATELET
Basophils Absolute: 0.1 10*3/uL (ref 0.0–0.1)
Basophils Relative: 0.5 % (ref 0.0–3.0)
EOS PCT: 3.1 % (ref 0.0–5.0)
Eosinophils Absolute: 0.3 10*3/uL (ref 0.0–0.7)
HCT: 40.2 % (ref 39.0–52.0)
Hemoglobin: 13.5 g/dL (ref 13.0–17.0)
LYMPHS ABS: 3.4 10*3/uL (ref 0.7–4.0)
Lymphocytes Relative: 35.1 % (ref 12.0–46.0)
MCHC: 33.6 g/dL (ref 30.0–36.0)
MCV: 90.8 fl (ref 78.0–100.0)
MONOS PCT: 7.9 % (ref 3.0–12.0)
Monocytes Absolute: 0.8 10*3/uL (ref 0.1–1.0)
NEUTROS ABS: 5.2 10*3/uL (ref 1.4–7.7)
NEUTROS PCT: 53.4 % (ref 43.0–77.0)
PLATELETS: 307 10*3/uL (ref 150.0–400.0)
RBC: 4.43 Mil/uL (ref 4.22–5.81)
RDW: 14.7 % (ref 11.5–15.5)
WBC: 9.6 10*3/uL (ref 4.0–10.5)

## 2016-01-02 LAB — LIPID PANEL
CHOLESTEROL: 144 mg/dL (ref 0–200)
HDL: 35.3 mg/dL — AB (ref >39.00)
LDL Cholesterol: 75 mg/dL (ref 0–99)
NONHDL: 108.2
TRIGLYCERIDES: 166 mg/dL — AB (ref 0.0–149.0)
Total CHOL/HDL Ratio: 4
VLDL: 33.2 mg/dL (ref 0.0–40.0)

## 2016-01-02 LAB — HEPATIC FUNCTION PANEL
ALBUMIN: 4.4 g/dL (ref 3.5–5.2)
ALT: 20 U/L (ref 0–53)
AST: 16 U/L (ref 0–37)
Alkaline Phosphatase: 86 U/L (ref 39–117)
BILIRUBIN DIRECT: 0.1 mg/dL (ref 0.0–0.3)
TOTAL PROTEIN: 6.7 g/dL (ref 6.0–8.3)
Total Bilirubin: 0.3 mg/dL (ref 0.2–1.2)

## 2016-01-02 LAB — BASIC METABOLIC PANEL
BUN: 24 mg/dL — ABNORMAL HIGH (ref 6–23)
CHLORIDE: 103 meq/L (ref 96–112)
CO2: 28 meq/L (ref 19–32)
CREATININE: 0.82 mg/dL (ref 0.40–1.50)
Calcium: 9.3 mg/dL (ref 8.4–10.5)
GFR: 106.9 mL/min (ref >60.00)
GLUCOSE: 105 mg/dL — AB (ref 70–99)
Potassium: 4.4 mEq/L (ref 3.5–5.1)
Sodium: 137 mEq/L (ref 135–145)

## 2016-01-04 LAB — TESTOSTERONE TOTAL,FREE,BIO, MALES
ALBUMIN: 4.4 g/dL (ref 3.6–5.1)
SEX HORMONE BINDING: 15 nmol/L (ref 10–50)
TESTOSTERONE: 154 ng/dL — AB (ref 250–827)
Testosterone, Bioavailable: 65.9 ng/dL — ABNORMAL LOW (ref 130.5–681.7)
Testosterone, Free: 32.7 pg/mL — ABNORMAL LOW (ref 47.0–244.0)

## 2016-01-07 ENCOUNTER — Encounter: Payer: Self-pay | Admitting: Family Medicine

## 2016-01-10 ENCOUNTER — Other Ambulatory Visit: Payer: Self-pay | Admitting: General Practice

## 2016-01-10 MED ORDER — TESTOSTERONE 10 MG/ACT (2%) TD GEL: TRANSDERMAL | Status: DC

## 2016-01-14 ENCOUNTER — Ambulatory Visit: Payer: Managed Care, Other (non HMO) | Admitting: Family Medicine

## 2016-03-11 ENCOUNTER — Other Ambulatory Visit: Payer: Self-pay | Admitting: Internal Medicine

## 2016-03-12 ENCOUNTER — Encounter: Payer: Self-pay | Admitting: Family Medicine

## 2016-03-12 MED ORDER — LISINOPRIL-HYDROCHLOROTHIAZIDE 20-12.5 MG PO TABS: 1.0000 | ORAL_TABLET | Freq: Every day | ORAL | 1 refills | Status: DC

## 2016-04-21 DIAGNOSIS — D126 Benign neoplasm of colon, unspecified: Secondary | ICD-10-CM | POA: Insufficient documentation

## 2016-05-16 LAB — HM COLONOSCOPY

## 2016-06-16 ENCOUNTER — Encounter: Payer: Self-pay | Admitting: Family Medicine

## 2016-06-16 ENCOUNTER — Encounter: Payer: Self-pay | Admitting: General Practice

## 2016-06-16 ENCOUNTER — Ambulatory Visit (INDEPENDENT_AMBULATORY_CARE_PROVIDER_SITE_OTHER): Payer: Managed Care, Other (non HMO) | Admitting: Family Medicine

## 2016-06-16 VITALS — BP 104/64 | HR 94 | Temp 98.1°F | Resp 16 | Ht 71.0 in | Wt 194.4 lb

## 2016-06-16 DIAGNOSIS — Z0000 Encounter for general adult medical examination without abnormal findings: Secondary | ICD-10-CM

## 2016-06-16 DIAGNOSIS — E349 Endocrine disorder, unspecified: Secondary | ICD-10-CM | POA: Diagnosis not present

## 2016-06-16 DIAGNOSIS — R7989 Other specified abnormal findings of blood chemistry: Secondary | ICD-10-CM

## 2016-06-16 LAB — BASIC METABOLIC PANEL
BUN: 23 mg/dL (ref 7–25)
CHLORIDE: 103 mmol/L (ref 98–110)
CO2: 26 mmol/L (ref 20–31)
Calcium: 9.2 mg/dL (ref 8.6–10.3)
Creat: 0.91 mg/dL (ref 0.60–1.35)
GLUCOSE: 93 mg/dL (ref 65–99)
POTASSIUM: 4.6 mmol/L (ref 3.5–5.3)
SODIUM: 137 mmol/L (ref 135–146)

## 2016-06-16 LAB — CBC WITH DIFFERENTIAL/PLATELET
BASOS ABS: 0 {cells}/uL (ref 0–200)
Basophils Relative: 0 %
EOS ABS: 202 {cells}/uL (ref 15–500)
EOS PCT: 2 %
HCT: 41.8 % (ref 38.5–50.0)
HEMOGLOBIN: 14.2 g/dL (ref 13.2–17.1)
LYMPHS ABS: 2929 {cells}/uL (ref 850–3900)
Lymphocytes Relative: 29 %
MCH: 30.9 pg (ref 27.0–33.0)
MCHC: 34 g/dL (ref 32.0–36.0)
MCV: 90.9 fL (ref 80.0–100.0)
MONOS PCT: 10 %
MPV: 9.6 fL (ref 7.5–12.5)
Monocytes Absolute: 1010 cells/uL — ABNORMAL HIGH (ref 200–950)
NEUTROS ABS: 5959 {cells}/uL (ref 1500–7800)
NEUTROS PCT: 59 %
Platelets: 341 10*3/uL (ref 140–400)
RBC: 4.6 MIL/uL (ref 4.20–5.80)
RDW: 14.4 % (ref 11.0–15.0)
WBC: 10.1 10*3/uL (ref 3.8–10.8)

## 2016-06-16 LAB — LIPID PANEL
CHOLESTEROL: 201 mg/dL — AB (ref <200)
HDL: 35 mg/dL — ABNORMAL LOW (ref >40)
LDL Cholesterol: 123 mg/dL — ABNORMAL HIGH (ref <100)
Total CHOL/HDL Ratio: 5.7 Ratio — ABNORMAL HIGH (ref <5.0)
Triglycerides: 217 mg/dL — ABNORMAL HIGH (ref <150)
VLDL: 43 mg/dL — ABNORMAL HIGH (ref <30)

## 2016-06-16 LAB — HEPATIC FUNCTION PANEL
ALBUMIN: 4.7 g/dL (ref 3.6–5.1)
ALK PHOS: 87 U/L (ref 40–115)
ALT: 32 U/L (ref 9–46)
AST: 25 U/L (ref 10–40)
BILIRUBIN INDIRECT: 0.2 mg/dL (ref 0.2–1.2)
Bilirubin, Direct: 0.1 mg/dL (ref <=0.2)
TOTAL PROTEIN: 7.3 g/dL (ref 6.1–8.1)
Total Bilirubin: 0.3 mg/dL (ref 0.2–1.2)

## 2016-06-16 NOTE — Assessment & Plan Note (Signed)
Pt's PE WNL.  UTD on colonoscopy (through [place]).  Check labs.  Anticipatory guidance provided.

## 2016-06-16 NOTE — Progress Notes (Signed)
Pre visit review using our clinic review tool, if applicable. No additional management support is needed unless otherwise documented below in the visit note. 

## 2016-06-16 NOTE — Patient Instructions (Signed)
Follow up in 6 months to recheck BP and cholesterol We'll notify you of your lab results and make any changes if needed Continue to work on healthy diet and regular exercise- you can do it! Try and quit smoking!! Call with any questions or concerns Happy Holidays!!! 

## 2016-06-16 NOTE — Progress Notes (Signed)
   Subjective:    Patient ID: Alejandro Ayers, male    DOB: September 21, 1970, 45 y.o.   MRN: [id number]  HPI CPE- UTD on Tdap, flu.  UTD on colonoscopy- done last month at [facility] and 9 polyps were removed- due for repeat in 3 yrs.   Review of Systems Patient reports no vision/hearing changes, anorexia, fever ,adenopathy, persistant/recurrent hoarseness, swallowing issues, chest pain, palpitations, edema, persistant/recurrent cough, hemoptysis, dyspnea (rest,exertional, paroxysmal nocturnal), gastrointestinal  bleeding (melena, rectal bleeding), abdominal pain, excessive heart burn, GU symptoms (dysuria, hematuria, voiding/incontinence issues) syncope, focal weakness, memory loss, numbness & tingling, skin/hair/nail changes, depression, anxiety, abnormal bruising/bleeding, musculoskeletal symptoms/signs.     Objective:   Physical Exam BP 104/64   Pulse 94   Temp 98.1 F (36.7 C) (Oral)   Resp 16   Ht 5\' 11"  (1.803 m)   Wt 194 lb 6 oz (88.2 kg)   SpO2 98%   BMI 27.11 kg/m   General Appearance:    Alert, cooperative, no distress, appears stated age  Head:    Normocephalic, without obvious abnormality, atraumatic  Eyes:    PERRL, conjunctiva/corneas clear, EOM's intact, fundi    benign, both eyes       Ears:    Normal TM's and external ear canals, both ears  Nose:   Nares normal, septum midline, mucosa normal, no drainage   or sinus tenderness  Throat:   Lips, mucosa, and tongue normal; teeth and gums normal  Neck:   Supple, symmetrical, trachea midline, no adenopathy;       thyroid:  No enlargement/tenderness/nodules  Back:     Symmetric, no curvature, ROM normal, no CVA tenderness  Lungs:     Clear to auscultation bilaterally, respirations unlabored  Chest wall:    No tenderness or deformity  Heart:    Regular rate and rhythm, S1 and S2 normal, no murmur, rub   or gallop  Abdomen:     Soft, non-tender, bowel sounds active all four quadrants,    no masses, no organomegaly  Genitalia:     Normal male without lesion, discharge or tenderness  Rectal:    Normal tone, normal prostate, no masses or tenderness  Extremities:   Extremities normal, atraumatic, no cyanosis or edema  Pulses:   2+ and symmetric all extremities  Skin:   Skin color, texture, turgor normal, no rashes or lesions  Lymph nodes:   Cervical, supraclavicular, and axillary nodes normal  Neurologic:   CNII-XII intact. Normal strength, sensation and reflexes      throughout          Assessment & Plan:

## 2016-06-17 LAB — TSH: TSH: 1.25 m[IU]/L (ref 0.40–4.50)

## 2016-06-17 LAB — TESTOSTERONE TOTAL,FREE,BIO, MALES
Albumin: 4.7 g/dL (ref 3.6–5.1)
Sex Hormone Binding: 19 nmol/L (ref 10–50)
TESTOSTERONE: 156 ng/dL — AB (ref 250–827)

## 2016-06-17 LAB — PSA: PSA: 0.5 ng/mL (ref <=4.0)

## 2016-06-20 ENCOUNTER — Encounter: Payer: Self-pay | Admitting: Family Medicine

## 2016-09-15 ENCOUNTER — Other Ambulatory Visit: Payer: Self-pay | Admitting: General Practice

## 2016-09-15 MED ORDER — LISINOPRIL-HYDROCHLOROTHIAZIDE 20-12.5 MG PO TABS: 1.0000 | ORAL_TABLET | Freq: Every day | ORAL | 1 refills | Status: DC

## 2016-10-15 ENCOUNTER — Encounter: Payer: Self-pay | Admitting: Family Medicine

## 2016-10-16 ENCOUNTER — Other Ambulatory Visit: Payer: Self-pay | Admitting: General Practice

## 2016-10-16 MED ORDER — ATORVASTATIN CALCIUM 20 MG PO TABS: 20.0000 mg | ORAL_TABLET | Freq: Every day | ORAL | 1 refills | Status: DC

## 2016-12-08 ENCOUNTER — Encounter: Payer: Self-pay | Admitting: Family Medicine

## 2016-12-08 ENCOUNTER — Ambulatory Visit (INDEPENDENT_AMBULATORY_CARE_PROVIDER_SITE_OTHER): Payer: Commercial Managed Care - PPO | Admitting: Family Medicine

## 2016-12-08 VITALS — BP 124/83 | HR 86 | Temp 98.6°F | Resp 16 | Ht 71.0 in | Wt 195.5 lb

## 2016-12-08 DIAGNOSIS — I10 Essential (primary) hypertension: Secondary | ICD-10-CM | POA: Diagnosis not present

## 2016-12-08 DIAGNOSIS — E7800 Pure hypercholesterolemia, unspecified: Secondary | ICD-10-CM | POA: Diagnosis not present

## 2016-12-08 LAB — CBC WITH DIFFERENTIAL/PLATELET
BASOS ABS: 0 {cells}/uL (ref 0–200)
Basophils Relative: 0 %
Eosinophils Absolute: 176 cells/uL (ref 15–500)
Eosinophils Relative: 2 %
HCT: 39.4 % (ref 38.5–50.0)
HEMOGLOBIN: 13.4 g/dL (ref 13.2–17.1)
Lymphocytes Relative: 28 %
Lymphs Abs: 2464 cells/uL (ref 850–3900)
MCH: 31.2 pg (ref 27.0–33.0)
MCHC: 34 g/dL (ref 32.0–36.0)
MCV: 91.6 fL (ref 80.0–100.0)
MONOS PCT: 11 %
MPV: 9.4 fL (ref 7.5–12.5)
Monocytes Absolute: 968 cells/uL — ABNORMAL HIGH (ref 200–950)
NEUTROS ABS: 5192 {cells}/uL (ref 1500–7800)
Neutrophils Relative %: 59 %
PLATELETS: 332 10*3/uL (ref 140–400)
RBC: 4.3 MIL/uL (ref 4.20–5.80)
RDW: 14.4 % (ref 11.0–15.0)
WBC: 8.8 10*3/uL (ref 3.8–10.8)

## 2016-12-08 NOTE — Progress Notes (Signed)
Pre visit review using our clinic review tool, if applicable. No additional management support is needed unless otherwise documented below in the visit note. 

## 2016-12-08 NOTE — Assessment & Plan Note (Signed)
Chronic problem.  Tolerating statin w/o difficulty.  Stressed need for healthy diet, regular exercise, and smoking cessation.  Check labs.  Adjust meds prn

## 2016-12-08 NOTE — Progress Notes (Signed)
   Subjective:    Patient ID: Alejandro Ayers, male    DOB: 1971/05/02, 46 y.o.   MRN: [id number]  HPI HTN- chronic problem, on Lisinopril HCTZ daily w/ good control.  Pt is not exercising regularly.  Continues to smoke 1 ppd.  Not interested in quitting.  No CP, SOB, HAs, visual changes, edema.  Hyperlipidemia- chronic problem, on Lipitor 20mg  daily.  Denies abd pain, N/V.   Review of Systems For ROS see HPI     Objective:   Physical Exam  Constitutional: He is oriented to person, place, and time. He appears well-developed and well-nourished. No distress.  HENT:  Head: Normocephalic and atraumatic.  Eyes: Conjunctivae and EOM are normal. Pupils are equal, round, and reactive to light.  Neck: Normal range of motion. Neck supple. No thyromegaly present.  Cardiovascular: Normal rate, regular rhythm, normal heart sounds and intact distal pulses.   No murmur heard. Pulmonary/Chest: Effort normal and breath sounds normal. No respiratory distress.  Abdominal: Soft. Bowel sounds are normal. He exhibits no distension.  Musculoskeletal: He exhibits no edema.  Lymphadenopathy:    He has no cervical adenopathy.  Neurological: He is alert and oriented to person, place, and time. No cranial nerve deficit.  Skin: Skin is warm and dry.  Psychiatric: He has a normal mood and affect. His behavior is normal.  Vitals reviewed.         Assessment & Plan:

## 2016-12-08 NOTE — Assessment & Plan Note (Signed)
Chronic problem.  Adequate control on current meds.  Asymptomatic.  Check labs.  No anticipated med changes.  Will follow. 

## 2016-12-08 NOTE — Patient Instructions (Signed)
Schedule your complete physical in 6 months We'll notify you of your lab results and make any changes if needed Continue to work on healthy diet and regular exercise- you can do it! Try and quit smoking!!! Call with any questions or concerns Hang in there!!!

## 2016-12-09 LAB — LIPID PANEL
CHOLESTEROL: 176 mg/dL (ref <200)
HDL: 34 mg/dL — ABNORMAL LOW (ref >40)
LDL Cholesterol: 104 mg/dL — ABNORMAL HIGH (ref <100)
Total CHOL/HDL Ratio: 5.2 Ratio — ABNORMAL HIGH (ref <5.0)
Triglycerides: 189 mg/dL — ABNORMAL HIGH (ref <150)
VLDL: 38 mg/dL — ABNORMAL HIGH (ref <30)

## 2016-12-09 LAB — HEPATIC FUNCTION PANEL
ALT: 16 U/L (ref 9–46)
AST: 16 U/L (ref 10–40)
Albumin: 4.5 g/dL (ref 3.6–5.1)
Alkaline Phosphatase: 77 U/L (ref 40–115)
BILIRUBIN INDIRECT: 0.2 mg/dL (ref 0.2–1.2)
Bilirubin, Direct: 0.1 mg/dL (ref <=0.2)
TOTAL PROTEIN: 6.8 g/dL (ref 6.1–8.1)
Total Bilirubin: 0.3 mg/dL (ref 0.2–1.2)

## 2016-12-09 LAB — BASIC METABOLIC PANEL
BUN: 20 mg/dL (ref 7–25)
CHLORIDE: 102 mmol/L (ref 98–110)
CO2: 26 mmol/L (ref 20–31)
Calcium: 9.3 mg/dL (ref 8.6–10.3)
Creat: 1.27 mg/dL (ref 0.60–1.35)
GLUCOSE: 80 mg/dL (ref 65–99)
POTASSIUM: 4.1 mmol/L (ref 3.5–5.3)
SODIUM: 138 mmol/L (ref 135–146)

## 2016-12-09 LAB — TSH: TSH: 1.93 m[IU]/L (ref 0.40–4.50)

## 2017-03-27 ENCOUNTER — Encounter: Payer: Self-pay | Admitting: Family Medicine

## 2017-03-27 ENCOUNTER — Other Ambulatory Visit: Payer: Self-pay | Admitting: Family Medicine

## 2017-03-30 ENCOUNTER — Other Ambulatory Visit: Payer: Self-pay | Admitting: General Practice

## 2017-03-30 MED ORDER — ATORVASTATIN CALCIUM 20 MG PO TABS: 20.0000 mg | ORAL_TABLET | Freq: Every day | ORAL | 0 refills | Status: DC

## 2017-06-18 ENCOUNTER — Encounter: Payer: Self-pay | Admitting: Family Medicine

## 2017-06-18 ENCOUNTER — Ambulatory Visit (INDEPENDENT_AMBULATORY_CARE_PROVIDER_SITE_OTHER): Payer: Commercial Managed Care - PPO | Admitting: Family Medicine

## 2017-06-18 ENCOUNTER — Encounter: Payer: Commercial Managed Care - PPO | Admitting: Family Medicine

## 2017-06-18 ENCOUNTER — Other Ambulatory Visit: Payer: Self-pay

## 2017-06-18 VITALS — BP 122/80 | HR 87 | Temp 98.5°F | Resp 16 | Ht 71.0 in | Wt 194.5 lb

## 2017-06-18 DIAGNOSIS — R7989 Other specified abnormal findings of blood chemistry: Secondary | ICD-10-CM | POA: Diagnosis not present

## 2017-06-18 DIAGNOSIS — I10 Essential (primary) hypertension: Secondary | ICD-10-CM | POA: Diagnosis not present

## 2017-06-18 DIAGNOSIS — M25522 Pain in left elbow: Secondary | ICD-10-CM

## 2017-06-18 DIAGNOSIS — Z0000 Encounter for general adult medical examination without abnormal findings: Secondary | ICD-10-CM

## 2017-06-18 LAB — HEPATIC FUNCTION PANEL
ALBUMIN: 4.8 g/dL (ref 3.5–5.2)
ALK PHOS: 86 U/L (ref 39–117)
ALT: 18 U/L (ref 0–53)
AST: 16 U/L (ref 0–37)
Bilirubin, Direct: 0.1 mg/dL (ref 0.0–0.3)
Total Bilirubin: 0.4 mg/dL (ref 0.2–1.2)
Total Protein: 7.6 g/dL (ref 6.0–8.3)

## 2017-06-18 LAB — CBC WITH DIFFERENTIAL/PLATELET
Basophils Absolute: 0.1 10*3/uL (ref 0.0–0.1)
Basophils Relative: 0.8 % (ref 0.0–3.0)
EOS PCT: 1.6 % (ref 0.0–5.0)
Eosinophils Absolute: 0.1 10*3/uL (ref 0.0–0.7)
HCT: 42.5 % (ref 39.0–52.0)
Hemoglobin: 14.2 g/dL (ref 13.0–17.0)
LYMPHS ABS: 2.5 10*3/uL (ref 0.7–4.0)
Lymphocytes Relative: 29.5 % (ref 12.0–46.0)
MCHC: 33.4 g/dL (ref 30.0–36.0)
MCV: 94.1 fl (ref 78.0–100.0)
MONOS PCT: 10.4 % (ref 3.0–12.0)
Monocytes Absolute: 0.9 10*3/uL (ref 0.1–1.0)
NEUTROS PCT: 57.7 % (ref 43.0–77.0)
Neutro Abs: 4.8 10*3/uL (ref 1.4–7.7)
Platelets: 340 10*3/uL (ref 150.0–400.0)
RBC: 4.52 Mil/uL (ref 4.22–5.81)
RDW: 14.8 % (ref 11.5–15.5)
WBC: 8.3 10*3/uL (ref 4.0–10.5)

## 2017-06-18 LAB — LIPID PANEL
CHOL/HDL RATIO: 5
Cholesterol: 175 mg/dL (ref 0–200)
HDL: 38.2 mg/dL — ABNORMAL LOW (ref >39.00)
LDL CALC: 120 mg/dL — AB (ref 0–99)
NONHDL: 137.23
TRIGLYCERIDES: 86 mg/dL (ref 0.0–149.0)
VLDL: 17.2 mg/dL (ref 0.0–40.0)

## 2017-06-18 LAB — TSH: TSH: 1.23 u[IU]/mL (ref 0.35–4.50)

## 2017-06-18 LAB — BASIC METABOLIC PANEL
BUN: 20 mg/dL (ref 6–23)
CO2: 30 mEq/L (ref 19–32)
Calcium: 9.8 mg/dL (ref 8.4–10.5)
Chloride: 100 mEq/L (ref 96–112)
Creatinine, Ser: 0.8 mg/dL (ref 0.40–1.50)
GFR: 109.31 mL/min (ref >60.00)
Glucose, Bld: 87 mg/dL (ref 70–99)
POTASSIUM: 4.3 meq/L (ref 3.5–5.1)
SODIUM: 136 meq/L (ref 135–145)

## 2017-06-18 LAB — PSA: PSA: 0.6 ng/mL (ref 0.10–4.00)

## 2017-06-18 NOTE — Assessment & Plan Note (Signed)
Chronic problem.  Well controlled today.  Asymptomatic.  Check labs.  No anticipated med changes. 

## 2017-06-18 NOTE — Assessment & Plan Note (Signed)
Chronic tennis elbow.  No relief w/ NSAIDs and counterforce strap.  Refer to ortho.

## 2017-06-18 NOTE — Assessment & Plan Note (Signed)
Pt's PE WNL.  UTD on Tdap, flu.  Check labs.  Anticipatory guidance provided.  

## 2017-06-18 NOTE — Assessment & Plan Note (Signed)
Check testosterone level and PSA level and adjust the dose as needed.

## 2017-06-18 NOTE — Patient Instructions (Signed)
Follow up in 6 months to recheck BP and cholesterol We'll notify you of your lab results and make any changes if needed Continue to work on healthy diet and regular exercise- you look great! We'll call you with your Ortho appt Stop Smoking! Call with any questions or concerns Happy Holidays!

## 2017-06-18 NOTE — Progress Notes (Signed)
   Subjective:    Patient ID: Alejandro Ayers, male    DOB: 1971/05/01, 46 y.o.   MRN: [id number]  HPI CPE- UTD on Tdap, flu shot.  No concerns today.     Review of Systems Patient reports no vision/hearing changes, anorexia, fever ,adenopathy, persistant/recurrent hoarseness, swallowing issues, chest pain, palpitations, edema, persistant/recurrent cough, hemoptysis, dyspnea (rest,exertional, paroxysmal nocturnal), gastrointestinal  bleeding (melena, rectal bleeding), abdominal pain, excessive heart burn, GU symptoms (dysuria, hematuria, voiding/incontinence issues) syncope, focal weakness, memory loss, numbness & tingling, skin/hair/nail changes, depression, anxiety, abnormal bruising/bleeding.  + L tennis elbow x6 months- pain, loss of strength despite NSAIDs and counterforce strap     Objective:   Physical Exam BP 122/80   Pulse 87   Temp 98.5 F (36.9 C) (Oral)   Resp 16   Ht 5\' 11"  (1.803 m)   Wt 194 lb 8 oz (88.2 kg)   SpO2 98%   BMI 27.13 kg/m   General Appearance:    Alert, cooperative, no distress, appears stated age  Head:    Normocephalic, without obvious abnormality, atraumatic  Eyes:    PERRL, conjunctiva/corneas clear, EOM's intact, fundi    benign, both eyes       Ears:    Normal TM's and external ear canals, both ears  Nose:   Nares normal, septum midline, mucosa normal, no drainage   or sinus tenderness  Throat:   Lips, mucosa, and tongue normal; teeth and gums normal  Neck:   Supple, symmetrical, trachea midline, no adenopathy;       thyroid:  No enlargement/tenderness/nodules  Back:     Symmetric, no curvature, ROM normal, no CVA tenderness  Lungs:     Clear to auscultation bilaterally, respirations unlabored  Chest wall:    No tenderness or deformity  Heart:    Regular rate and rhythm, S1 and S2 normal, no murmur, rub   or gallop  Abdomen:     Soft, non-tender, bowel sounds active all four quadrants,    no masses, no organomegaly  Genitalia:    Normal  male without lesion, discharge or tenderness  Rectal:    Normal tone, normal prostate, no masses or tenderness  Extremities:   Extremities normal, atraumatic, no cyanosis or edema  Pulses:   2+ and symmetric all extremities  Skin:   Skin color, texture, turgor normal, no rashes or lesions  Lymph nodes:   Cervical, supraclavicular, and axillary nodes normal  Neurologic:   CNII-XII intact. Normal strength, sensation and reflexes      throughout          Assessment & Plan:

## 2017-06-19 ENCOUNTER — Encounter: Payer: Self-pay | Admitting: General Practice

## 2017-06-20 LAB — TESTOSTERONE TOTAL,FREE,BIO, MALES
ALBUMIN MSPROF: 4.6 g/dL (ref 3.6–5.1)
SEX HORMONE BINDING: 14 nmol/L (ref 10–50)
TESTOSTERONE FREE: 58.2 pg/mL (ref 46.0–224.0)
TESTOSTERONE: 258 ng/dL (ref 250–827)
Testosterone, Bioavailable: 122.3 ng/dL (ref >=110.0)

## 2017-06-22 ENCOUNTER — Encounter: Payer: Self-pay | Admitting: General Practice

## 2017-06-25 ENCOUNTER — Other Ambulatory Visit: Payer: Self-pay | Admitting: Family Medicine

## 2017-09-20 ENCOUNTER — Other Ambulatory Visit: Payer: Self-pay | Admitting: Family Medicine

## 2017-12-16 ENCOUNTER — Encounter: Payer: Self-pay | Admitting: Family Medicine

## 2017-12-16 ENCOUNTER — Other Ambulatory Visit: Payer: Self-pay

## 2017-12-16 ENCOUNTER — Ambulatory Visit: Payer: Commercial Managed Care - PPO | Admitting: Family Medicine

## 2017-12-16 VITALS — BP 122/76 | HR 83 | Temp 98.6°F | Resp 15 | Ht 71.0 in | Wt 194.6 lb

## 2017-12-16 DIAGNOSIS — E7800 Pure hypercholesterolemia, unspecified: Secondary | ICD-10-CM

## 2017-12-16 DIAGNOSIS — I10 Essential (primary) hypertension: Secondary | ICD-10-CM

## 2017-12-16 DIAGNOSIS — Z720 Tobacco use: Secondary | ICD-10-CM

## 2017-12-16 NOTE — Assessment & Plan Note (Signed)
Ongoing issue.  Pt is interested in quitting but not committed.  Failed Chantix previously.  Has convinced himself he will need to go cold [place].  Encouraged him to decrease his smoking gradually rather than going directly from 1ppd to nothing.  Will follow.

## 2017-12-16 NOTE — Patient Instructions (Signed)
Schedule your complete physical in 6 months We'll notify you of your lab results and make any changes if needed Continue to work on healthy diet and regular exercise- you look great! STOP SMOKING!!!  You can do it! Call with any questions or concerns Have a great summer!!

## 2017-12-16 NOTE — Assessment & Plan Note (Signed)
Chronic problem.  Adequate control.  Asymptomatic.  Check labs.  No anticipated med changes.  Will follow. 

## 2017-12-16 NOTE — Progress Notes (Signed)
   Subjective:    Patient ID: Alejandro Ayers, male    DOB: Jan 20, 1971, 47 y.o.   MRN: [id number]  HPI HTN- chronic problem, on Lisinopril HCTZ 20/12.5mg  w/ good control.  No CP, SOB, HAs, visual changes, edema.  Hyperlipidemia- chronic problem, on Lipitor 20mg  daily.  No abd pain, N/V.  Walking dog regularly but no formal exercise.  Tobacco abuse- pt is smoking 1 ppd.  Previously failed Chantix.  Got a new car and is not allowing himself to smoke in this.   Review of Systems For ROS see HPI     Objective:   Physical Exam  Constitutional: He is oriented to person, place, and time. He appears well-developed and well-nourished. No distress.  HENT:  Head: Normocephalic and atraumatic.  Eyes: Pupils are equal, round, and reactive to light. Conjunctivae and EOM are normal.  Neck: Normal range of motion. Neck supple. No thyromegaly present.  Cardiovascular: Normal rate, regular rhythm, normal heart sounds and intact distal pulses.  No murmur heard. Pulmonary/Chest: Effort normal and breath sounds normal. No respiratory distress.  Abdominal: Soft. Bowel sounds are normal. He exhibits no distension.  Musculoskeletal: He exhibits no edema.  Lymphadenopathy:    He has no cervical adenopathy.  Neurological: He is alert and oriented to person, place, and time. No cranial nerve deficit.  Skin: Skin is warm and dry.  Psychiatric: He has a normal mood and affect. His behavior is normal.  Vitals reviewed.         Assessment & Plan:

## 2017-12-16 NOTE — Assessment & Plan Note (Signed)
Chronic problem.  Tolerating statin w/o difficulty.  Check labs.  Adjust meds prn  

## 2017-12-17 ENCOUNTER — Encounter: Payer: Self-pay | Admitting: General Practice

## 2017-12-17 LAB — HEPATIC FUNCTION PANEL
AG RATIO: 1.9 (calc) (ref 1.0–2.5)
ALT: 15 U/L (ref 9–46)
AST: 17 U/L (ref 10–40)
Albumin: 4.4 g/dL (ref 3.6–5.1)
Alkaline phosphatase (APISO): 87 U/L (ref 40–115)
BILIRUBIN INDIRECT: 0.3 mg/dL (ref 0.2–1.2)
BILIRUBIN TOTAL: 0.3 mg/dL (ref 0.2–1.2)
Bilirubin, Direct: 0 mg/dL (ref 0.0–0.2)
GLOBULIN: 2.3 g/dL (ref 1.9–3.7)
TOTAL PROTEIN: 6.7 g/dL (ref 6.1–8.1)

## 2017-12-17 LAB — BASIC METABOLIC PANEL
BUN: 25 mg/dL (ref 7–25)
CO2: 27 mmol/L (ref 20–32)
CREATININE: 0.94 mg/dL (ref 0.60–1.35)
Calcium: 9.2 mg/dL (ref 8.6–10.3)
Chloride: 102 mmol/L (ref 98–110)
Glucose, Bld: 107 mg/dL — ABNORMAL HIGH (ref 65–99)
POTASSIUM: 4.2 mmol/L (ref 3.5–5.3)
SODIUM: 138 mmol/L (ref 135–146)

## 2017-12-17 LAB — CBC WITH DIFFERENTIAL/PLATELET
BASOS ABS: 60 {cells}/uL (ref 0–200)
BASOS PCT: 0.7 %
EOS PCT: 2.6 %
Eosinophils Absolute: 221 cells/uL (ref 15–500)
HEMATOCRIT: 38.8 % (ref 38.5–50.0)
HEMOGLOBIN: 13.2 g/dL (ref 13.2–17.1)
LYMPHS ABS: 2737 {cells}/uL (ref 850–3900)
MCH: 30.8 pg (ref 27.0–33.0)
MCHC: 34 g/dL (ref 32.0–36.0)
MCV: 90.4 fL (ref 80.0–100.0)
MPV: 10.2 fL (ref 7.5–12.5)
Monocytes Relative: 9.3 %
NEUTROS ABS: 4692 {cells}/uL (ref 1500–7800)
Neutrophils Relative %: 55.2 %
Platelets: 328 10*3/uL (ref 140–400)
RBC: 4.29 10*6/uL (ref 4.20–5.80)
RDW: 13.1 % (ref 11.0–15.0)
Total Lymphocyte: 32.2 %
WBC mixed population: 791 cells/uL (ref 200–950)
WBC: 8.5 10*3/uL (ref 3.8–10.8)

## 2017-12-17 LAB — LIPID PANEL
CHOL/HDL RATIO: 4.4 (calc) (ref <5.0)
Cholesterol: 159 mg/dL (ref <200)
HDL: 36 mg/dL — AB (ref >40)
LDL Cholesterol (Calc): 97 mg/dL (calc)
Non-HDL Cholesterol (Calc): 123 mg/dL (calc) (ref <130)
Triglycerides: 164 mg/dL — ABNORMAL HIGH (ref <150)

## 2017-12-17 LAB — TSH: TSH: 1.37 mIU/L (ref 0.40–4.50)

## 2018-01-04 ENCOUNTER — Other Ambulatory Visit: Payer: Self-pay | Admitting: Family Medicine

## 2018-02-22 ENCOUNTER — Ambulatory Visit (INDEPENDENT_AMBULATORY_CARE_PROVIDER_SITE_OTHER): Payer: Commercial Managed Care - PPO

## 2018-02-22 ENCOUNTER — Ambulatory Visit: Payer: Commercial Managed Care - PPO | Admitting: Podiatry

## 2018-02-22 DIAGNOSIS — M722 Plantar fascial fibromatosis: Secondary | ICD-10-CM

## 2018-02-22 DIAGNOSIS — M779 Enthesopathy, unspecified: Secondary | ICD-10-CM

## 2018-02-22 DIAGNOSIS — M778 Other enthesopathies, not elsewhere classified: Secondary | ICD-10-CM

## 2018-02-22 MED ORDER — METHYLPREDNISOLONE 4 MG PO TBPK: ORAL_TABLET | ORAL | 0 refills | Status: DC

## 2018-02-22 MED ORDER — MELOXICAM 15 MG PO TABS: 15.0000 mg | ORAL_TABLET | Freq: Every day | ORAL | 1 refills | Status: AC

## 2018-02-22 NOTE — Progress Notes (Signed)
Dg  

## 2018-02-24 NOTE — Progress Notes (Signed)
   Subjective: 47 year old male presenting today as a new patient with a chief complaint of bilateral foot pain that began three weeks ago. He states the pain is worse when he first wakes up in the morning. He has tried wearing different shoes with no significant relief. Patient is here for further evaluation and treatment.   Past Medical History:  Diagnosis Date  . Eczema   . Hyperlipidemia   . Hypertension   . Tennis elbow    Had a cortisone injection in left elbow in the beginning of 2019     Objective: Physical Exam General: The patient is alert and oriented x3 in no acute distress.  Dermatology: Skin is warm, dry and supple bilateral lower extremities. Negative for open lesions or macerations bilateral.   Vascular: Dorsalis Pedis and Posterior Tibial pulses palpable bilateral.  Capillary fill time is immediate to all digits.  Neurological: Epicritic and protective threshold intact bilateral.   Musculoskeletal: Tenderness to palpation to the plantar aspect of the bilateral heels along the plantar fascia. All other joints range of motion within normal limits bilateral. Strength 5/5 in all groups bilateral.   Radiographic exam: Normal osseous mineralization. Joint spaces preserved. No fracture/dislocation/boney destruction. No other soft tissue abnormalities or radiopaque foreign bodies.   Assessment: 1. plantar fasciitis bilateral feet - midsubstance   Plan of Care:  1. Patient evaluated. Xrays reviewed.   2. Injection of 0.5cc Celestone soluspan injected into the bilateral heels.  3. Rx for Medrol Dose Pak placed 4. Rx for Meloxicam 15 mg ordered for patient. 5. Plantar fascial band(s) dispensed for bilateral plantar fasciitis. 6. Instructed patient regarding therapies and modalities at home to alleviate symptoms.  7. Return to clinic in 4 weeks.    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   Edrick Kins, DPM [facility]  Dr. Edrick Kins, DPM      [address], [place] [address]                Office [phone]  Fax [phone]

## 2018-03-16 ENCOUNTER — Other Ambulatory Visit: Payer: Self-pay | Admitting: General Practice

## 2018-03-16 MED ORDER — LISINOPRIL-HYDROCHLOROTHIAZIDE 20-12.5 MG PO TABS: 1.0000 | ORAL_TABLET | Freq: Every day | ORAL | 1 refills | Status: DC

## 2018-03-24 ENCOUNTER — Ambulatory Visit: Payer: Commercial Managed Care - PPO | Admitting: Podiatry

## 2018-05-18 ENCOUNTER — Other Ambulatory Visit: Payer: Self-pay | Admitting: Family Medicine

## 2018-05-24 ENCOUNTER — Telehealth: Payer: Self-pay | Admitting: Family Medicine

## 2018-05-24 NOTE — Telephone Encounter (Signed)
Paperwork placed in PCP folder for signature.  

## 2018-05-24 NOTE — Telephone Encounter (Signed)
Please a health provider form in bin upfront with a charge sheet.

## 2018-05-25 NOTE — Telephone Encounter (Signed)
Pt aware, he will come pick up the forms tomorrow.

## 2018-05-25 NOTE — Telephone Encounter (Signed)
Form completed and placed in basket  

## 2018-05-25 NOTE — Telephone Encounter (Signed)
fyi

## 2018-06-24 ENCOUNTER — Other Ambulatory Visit: Payer: Self-pay

## 2018-06-24 ENCOUNTER — Ambulatory Visit (INDEPENDENT_AMBULATORY_CARE_PROVIDER_SITE_OTHER): Payer: Commercial Managed Care - PPO | Admitting: Family Medicine

## 2018-06-24 ENCOUNTER — Encounter: Payer: Self-pay | Admitting: Family Medicine

## 2018-06-24 VITALS — BP 138/78 | HR 98 | Temp 98.7°F | Resp 14 | Ht 72.0 in | Wt 188.0 lb

## 2018-06-24 DIAGNOSIS — Z0000 Encounter for general adult medical examination without abnormal findings: Secondary | ICD-10-CM | POA: Diagnosis not present

## 2018-06-24 DIAGNOSIS — I10 Essential (primary) hypertension: Secondary | ICD-10-CM | POA: Diagnosis not present

## 2018-06-24 DIAGNOSIS — Z125 Encounter for screening for malignant neoplasm of prostate: Secondary | ICD-10-CM | POA: Diagnosis not present

## 2018-06-24 NOTE — Assessment & Plan Note (Signed)
Chronic problem.  Adequate control.  Asymptomatic.  Check labs.  No anticipated med changes.  Will follow. 

## 2018-06-24 NOTE — Progress Notes (Signed)
   Subjective:    Patient ID: Alejandro Ayers, male    DOB: [birth year], 47 y.o.   MRN: [id number]  HPI CPE- UTD on colonoscopy, flu, Tdap.  Down 7 lbs since last visit.  No concerns today.   Review of Systems Patient reports no vision/hearing changes, anorexia, fever ,adenopathy, persistant/recurrent hoarseness, swallowing issues, chest pain, palpitations, edema, persistant/recurrent cough, hemoptysis, dyspnea (rest,exertional, paroxysmal nocturnal), gastrointestinal  bleeding (melena, rectal bleeding), abdominal pain, excessive heart burn, GU symptoms (dysuria, hematuria, voiding/incontinence issues) syncope, focal weakness, memory loss, numbness & tingling, skin/hair/nail changes, depression, anxiety, abnormal bruising/bleeding, musculoskeletal symptoms/signs.     Objective:   Physical Exam General Appearance:    Alert, cooperative, no distress, appears stated age  Head:    Normocephalic, without obvious abnormality, atraumatic  Eyes:    PERRL, conjunctiva/corneas clear, EOM's intact, fundi    benign, both eyes       Ears:    Normal TM's and external ear canals, both ears  Nose:   Nares normal, septum midline, mucosa normal, no drainage   or sinus tenderness  Throat:   Lips, mucosa, and tongue normal; teeth and gums normal  Neck:   Supple, symmetrical, trachea midline, no adenopathy;       thyroid:  No enlargement/tenderness/nodules  Back:     Symmetric, no curvature, ROM normal, no CVA tenderness  Lungs:     Clear to auscultation bilaterally, respirations unlabored  Chest wall:    No tenderness or deformity  Heart:    Regular rate and rhythm, S1 and S2 normal, no murmur, rub   or gallop  Abdomen:     Soft, non-tender, bowel sounds active all four quadrants,    no masses, no organomegaly  Genitalia:    Deferred at pt's request  Rectal:    Extremities:   Extremities normal, atraumatic, no cyanosis or edema  Pulses:   2+ and symmetric all extremities  Skin:   Skin color, texture,  turgor normal, no rashes or lesions  Lymph nodes:   Cervical, supraclavicular, and axillary nodes normal  Neurologic:   CNII-XII intact. Normal strength, sensation and reflexes      throughout          Assessment & Plan:

## 2018-06-24 NOTE — Patient Instructions (Signed)
Follow up in 6 months to recheck BP and cholesterol We'll notify you of your lab results and make any changes if needed Keep up the good work!  You look great!! Call with any questions or concerns Happy Holidays!!!

## 2018-06-24 NOTE — Assessment & Plan Note (Signed)
Pt's PE WNL.  UTD on colonoscopy, immunizations.  Declined GU exam today.  Check PSA.  Check labs.  Anticipatory guidance provided.

## 2018-06-25 ENCOUNTER — Encounter: Payer: Self-pay | Admitting: General Practice

## 2018-06-25 LAB — CBC WITH DIFFERENTIAL/PLATELET
Basophils Absolute: 0 10*3/uL (ref 0.0–0.1)
Basophils Relative: 0.3 % (ref 0.0–3.0)
Eosinophils Absolute: 0.2 10*3/uL (ref 0.0–0.7)
Eosinophils Relative: 1.7 % (ref 0.0–5.0)
HCT: 41 % (ref 39.0–52.0)
HEMOGLOBIN: 14.1 g/dL (ref 13.0–17.0)
Lymphocytes Relative: 27.6 % (ref 12.0–46.0)
Lymphs Abs: 2.6 10*3/uL (ref 0.7–4.0)
MCHC: 34.4 g/dL (ref 30.0–36.0)
MCV: 91.9 fl (ref 78.0–100.0)
MONO ABS: 0.9 10*3/uL (ref 0.1–1.0)
Monocytes Relative: 10.1 % (ref 3.0–12.0)
Neutro Abs: 5.6 10*3/uL (ref 1.4–7.7)
Neutrophils Relative %: 60.3 % (ref 43.0–77.0)
Platelets: 350 10*3/uL (ref 150.0–400.0)
RBC: 4.46 Mil/uL (ref 4.22–5.81)
RDW: 14.4 % (ref 11.5–15.5)
WBC: 9.3 10*3/uL (ref 4.0–10.5)

## 2018-06-25 LAB — BASIC METABOLIC PANEL
BUN: 24 mg/dL — AB (ref 6–23)
CHLORIDE: 100 meq/L (ref 96–112)
CO2: 27 meq/L (ref 19–32)
CREATININE: 0.84 mg/dL (ref 0.40–1.50)
Calcium: 9.6 mg/dL (ref 8.4–10.5)
GFR: 102.9 mL/min (ref >60.00)
Glucose, Bld: 97 mg/dL (ref 70–99)
POTASSIUM: 4.1 meq/L (ref 3.5–5.1)
Sodium: 137 mEq/L (ref 135–145)

## 2018-06-25 LAB — HEPATIC FUNCTION PANEL
ALBUMIN: 4.8 g/dL (ref 3.5–5.2)
ALK PHOS: 80 U/L (ref 39–117)
ALT: 20 U/L (ref 0–53)
AST: 17 U/L (ref 0–37)
Bilirubin, Direct: 0.1 mg/dL (ref 0.0–0.3)
Total Bilirubin: 0.4 mg/dL (ref 0.2–1.2)
Total Protein: 7.2 g/dL (ref 6.0–8.3)

## 2018-06-25 LAB — LIPID PANEL
Cholesterol: 156 mg/dL (ref 0–200)
HDL: 36.6 mg/dL — AB (ref >39.00)
LDL Cholesterol: 92 mg/dL (ref 0–99)
NonHDL: 119.69
TRIGLYCERIDES: 139 mg/dL (ref 0.0–149.0)
Total CHOL/HDL Ratio: 4
VLDL: 27.8 mg/dL (ref 0.0–40.0)

## 2018-06-25 LAB — PSA: PSA: 0.51 ng/mL (ref 0.10–4.00)

## 2018-06-25 LAB — TSH: TSH: 1.06 u[IU]/mL (ref 0.35–4.50)

## 2018-08-27 DIAGNOSIS — I10 Essential (primary) hypertension: Secondary | ICD-10-CM | POA: Diagnosis not present

## 2018-08-27 DIAGNOSIS — E785 Hyperlipidemia, unspecified: Secondary | ICD-10-CM | POA: Diagnosis not present

## 2018-08-29 ENCOUNTER — Encounter: Payer: Self-pay | Admitting: Family Medicine

## 2018-08-30 ENCOUNTER — Other Ambulatory Visit: Payer: Self-pay | Admitting: Family Medicine

## 2018-08-31 MED ORDER — NICOTINE 7 MG/24HR TD PT24: 7.0000 mg | MEDICATED_PATCH | Freq: Every day | TRANSDERMAL | 0 refills | Status: DC

## 2018-08-31 MED ORDER — NICOTINE 14 MG/24HR TD PT24: 14.0000 mg | MEDICATED_PATCH | Freq: Every day | TRANSDERMAL | 0 refills | Status: DC

## 2018-08-31 MED ORDER — NICOTINE 21 MG/24HR TD PT24: 21.0000 mg | MEDICATED_PATCH | Freq: Every day | TRANSDERMAL | 0 refills | Status: DC

## 2018-09-15 ENCOUNTER — Other Ambulatory Visit: Payer: Self-pay | Admitting: Family Medicine

## 2018-12-23 ENCOUNTER — Other Ambulatory Visit: Payer: Self-pay | Admitting: Family Medicine

## 2018-12-24 ENCOUNTER — Other Ambulatory Visit: Payer: Self-pay

## 2018-12-24 ENCOUNTER — Ambulatory Visit (INDEPENDENT_AMBULATORY_CARE_PROVIDER_SITE_OTHER): Payer: Commercial Managed Care - PPO | Admitting: Family Medicine

## 2018-12-24 ENCOUNTER — Encounter: Payer: Self-pay | Admitting: Family Medicine

## 2018-12-24 VITALS — Temp 99.1°F | Ht 72.0 in | Wt 199.0 lb

## 2018-12-24 DIAGNOSIS — E7800 Pure hypercholesterolemia, unspecified: Secondary | ICD-10-CM

## 2018-12-24 DIAGNOSIS — I10 Essential (primary) hypertension: Secondary | ICD-10-CM

## 2018-12-24 DIAGNOSIS — R635 Abnormal weight gain: Secondary | ICD-10-CM

## 2018-12-24 MED ORDER — MELOXICAM 15 MG PO TABS: 15.0000 mg | ORAL_TABLET | Freq: Every day | ORAL | 0 refills | Status: DC

## 2018-12-24 NOTE — Progress Notes (Signed)
I have discussed the procedure for the virtual visit with the patient who has given consent to proceed with assessment and treatment.   Zaxton Angerer L Lennon Richins, CMA     

## 2018-12-24 NOTE — Progress Notes (Signed)
Virtual Visit via Video   I connected with patient on 12/24/18 at  3:20 PM EDT by a video enabled telemedicine application and verified that I am speaking with the correct person using two identifiers.  Location patient: Home Location provider: [facility]up[facility], Office Persons participating in the virtual visit: Patient, Provider, [place] (Jess B)  I discussed the limitations of evaluation and management by telemedicine and the availability of in person appointments. The patient expressed understanding and agreed to proceed.  Subjective:   HPI:   HTN- chronic problem, on Lisinopril HCTZ 20/12.5mg  daily.  No way to check BP today.  No CP, SOB, HAs, visual changes, edema.  Hyperlipidemia- chronic problem.  On Lipitor 20mg  daily.  No abd pain, N/V.  Overweight- pt has gained 11 lbs since CPE.  Pt has very active job but no formal exercise.  L forearm numbness- pt reports from elbow to fingers will go numb.  Was worse at night but pt bought wrist splint which improved sxs.  Now happening 1-2x/day at work.  Denies shoulder/neck tightness.  ROS:   See pertinent positives and negatives per HPI.  Patient Active Problem List   Diagnosis Date Noted  . Right elbow tendinitis 06/12/2015  . Physical exam 12/18/2014  . Sty 12/18/2014  . Essential hypertension, benign 09/25/2012  . Low testosterone 09/25/2012  . Pure hypercholesterolemia 09/25/2012  . Tobacco abuse 09/25/2012  . History of anemia 09/25/2012    Social History   Tobacco Use  . Smoking status: Former Smoker    Packs/day: 1.00    Types: Cigarettes    Last attempt to quit: 09/13/2018    Years since quitting: 0.2  . Smokeless tobacco: Never Used  Substance Use Topics  . Alcohol use: Yes    Alcohol/week: 0.0 standard drinks    Current Outpatient Medications:  .  atorvastatin (LIPITOR) 20 MG tablet, TAKE 1 TABLET DAILY, Disp: 90 tablet, Rfl: 0 .  lisinopril-hydrochlorothiazide (PRINZIDE,ZESTORETIC) 20-12.5 MG tablet,  TAKE 1 TABLET DAILY, Disp: 90 tablet, Rfl: 1 .  MULTIPLE VITAMIN PO, Take 1 tablet by mouth daily., Disp: , Rfl:  .  Testosterone 10 MG/ACT (2%) GEL, Please apply 4 pumps to the front of your thighs daily., Disp: 180 g, Rfl: 1  Allergies  Allergen Reactions  . Shellfish Allergy Itching    Palms of hands and bottoms of feet    Objective:   Temp 99.1 F (37.3 C) (Oral)   Ht 6' (1.829 m)   Wt 199 lb (90.3 kg)   BMI 26.99 kg/m  AAOx3, NAD NCAT, EOMI No obvious CN deficits Coloring WNL Pt is able to speak clearly, coherently without shortness of breath or increased work of breathing.  Thought process is linear.  Mood is appropriate.   Assessment and Plan:   HTN- chronic problem.  Unable to check BP today.  Asymptomatic.  Will check BP when he comes for labs and adjust meds prn.  Hyperlipidemia- chronic problem.  Tolerating statin w/o difficulty.  Check labs.  Adjust meds prn  Weight gain- pt has gained 11 lbs since last visit.  This is in the setting of him quitting smoking so I'll take it.  Encouraged healthy diet and regular exercise.  L arm numbness- pt likely has a component of carpal tunnel that accounts for her wrist and hand numbness but the forearm numbness is likely coming from elbow or neck.  Start once daily Meloxicam and continue wrist brace.  If no improvement will send back to Dr Caralyn Guile.  Pt expressed understanding and is in agreement w/ plan.   Annye Asa, MD 12/24/2018

## 2018-12-25 ENCOUNTER — Encounter: Payer: Self-pay | Admitting: Family Medicine

## 2018-12-27 ENCOUNTER — Ambulatory Visit (INDEPENDENT_AMBULATORY_CARE_PROVIDER_SITE_OTHER): Payer: Commercial Managed Care - PPO

## 2018-12-27 ENCOUNTER — Other Ambulatory Visit: Payer: Self-pay

## 2018-12-27 DIAGNOSIS — I10 Essential (primary) hypertension: Secondary | ICD-10-CM | POA: Diagnosis not present

## 2018-12-27 DIAGNOSIS — E7800 Pure hypercholesterolemia, unspecified: Secondary | ICD-10-CM

## 2018-12-27 NOTE — Addendum Note (Signed)
Addended by: Doran Clay A on: 12/27/2018 03:17 PM   Modules accepted: Orders

## 2018-12-30 LAB — BASIC METABOLIC PANEL
BUN: 20 mg/dL (ref 7–25)
CO2: 23 mmol/L (ref 20–32)
Calcium: 9 mg/dL (ref 8.6–10.3)
Chloride: 105 mmol/L (ref 98–110)
Creat: 0.9 mg/dL (ref 0.70–1.33)
Glucose, Bld: 124 mg/dL — ABNORMAL HIGH (ref 65–99)
Potassium: 3.9 mmol/L (ref 3.5–5.3)
Sodium: 138 mmol/L (ref 135–146)

## 2018-12-30 LAB — CBC WITH DIFFERENTIAL/PLATELET
Absolute Monocytes: 922 cells/uL (ref 200–950)
Basophils Absolute: 39 cells/uL (ref 0–200)
Basophils Relative: 0.4 %
Eosinophils Absolute: 136 cells/uL (ref 15–500)
Eosinophils Relative: 1.4 %
HCT: 39.3 % (ref 38.5–50.0)
Hemoglobin: 13.3 g/dL (ref 13.2–17.1)
Lymphs Abs: 2638 cells/uL (ref 850–3900)
MCH: 30.6 pg (ref 27.0–33.0)
MCHC: 33.8 g/dL (ref 32.0–36.0)
MCV: 90.6 fL (ref 80.0–100.0)
MPV: 10.2 fL (ref 7.5–12.5)
Monocytes Relative: 9.5 %
Neutro Abs: 5966 cells/uL (ref 1500–7800)
Neutrophils Relative %: 61.5 %
Platelets: 330 10*3/uL (ref 140–400)
RBC: 4.34 10*6/uL (ref 4.20–5.80)
RDW: 13 % (ref 11.0–15.0)
Total Lymphocyte: 27.2 %
WBC: 9.7 10*3/uL (ref 3.8–10.8)

## 2018-12-30 LAB — HEPATIC FUNCTION PANEL
AG Ratio: 2 (calc) (ref 1.0–2.5)
ALT: 24 U/L (ref 9–46)
AST: 18 U/L (ref 10–35)
Albumin: 4.5 g/dL (ref 3.6–5.1)
Alkaline phosphatase (APISO): 76 U/L (ref 35–144)
Bilirubin, Direct: 0.1 mg/dL (ref 0.0–0.2)
Globulin: 2.3 g/dL (calc) (ref 1.9–3.7)
Indirect Bilirubin: 0.2 mg/dL (calc) (ref 0.2–1.2)
Total Bilirubin: 0.3 mg/dL (ref 0.2–1.2)
Total Protein: 6.8 g/dL (ref 6.1–8.1)

## 2018-12-30 LAB — TEST AUTHORIZATION

## 2018-12-30 LAB — LIPID PANEL
Cholesterol: 170 mg/dL (ref <200)
HDL: 42 mg/dL (ref >=40)
LDL Cholesterol (Calc): 99 mg/dL (calc)
Non-HDL Cholesterol (Calc): 128 mg/dL (calc) (ref <130)
Total CHOL/HDL Ratio: 4 (calc) (ref <5.0)
Triglycerides: 192 mg/dL — ABNORMAL HIGH (ref <150)

## 2018-12-30 LAB — HEMOGLOBIN A1C W/OUT EAG: Hgb A1c MFr Bld: 5.5 % of total Hgb (ref <5.7)

## 2018-12-30 LAB — TSH: TSH: 1.75 mIU/L (ref 0.40–4.50)

## 2019-01-05 ENCOUNTER — Encounter: Payer: Self-pay | Admitting: Family Medicine

## 2019-01-25 ENCOUNTER — Encounter: Payer: Self-pay | Admitting: Orthopaedic Surgery

## 2019-01-25 ENCOUNTER — Other Ambulatory Visit: Payer: Self-pay

## 2019-01-25 ENCOUNTER — Ambulatory Visit (INDEPENDENT_AMBULATORY_CARE_PROVIDER_SITE_OTHER): Payer: Commercial Managed Care - PPO | Admitting: Orthopaedic Surgery

## 2019-01-25 ENCOUNTER — Ambulatory Visit: Payer: Self-pay

## 2019-01-25 VITALS — Ht 72.0 in | Wt 192.0 lb

## 2019-01-25 DIAGNOSIS — M7541 Impingement syndrome of right shoulder: Secondary | ICD-10-CM | POA: Diagnosis not present

## 2019-01-25 DIAGNOSIS — R200 Anesthesia of skin: Secondary | ICD-10-CM | POA: Diagnosis not present

## 2019-01-25 DIAGNOSIS — M25511 Pain in right shoulder: Secondary | ICD-10-CM | POA: Diagnosis not present

## 2019-01-25 DIAGNOSIS — R202 Paresthesia of skin: Secondary | ICD-10-CM

## 2019-01-25 MED ORDER — BUPIVACAINE HCL 0.25 % IJ SOLN
4.0000 mL | INTRAMUSCULAR | Status: AC | PRN
  Administered 2019-01-25: 4 mL via INTRA_ARTICULAR

## 2019-01-25 MED ORDER — METHYLPREDNISOLONE ACETATE 40 MG/ML IJ SUSP
40.0000 mg | INTRAMUSCULAR | Status: AC | PRN
  Administered 2019-01-25: 40 mg via INTRA_ARTICULAR

## 2019-01-25 MED ORDER — LIDOCAINE HCL 1 % IJ SOLN
0.5000 mL | INTRAMUSCULAR | Status: AC | PRN
  Administered 2019-01-25: 11:00:00 .5 mL

## 2019-01-25 NOTE — Progress Notes (Signed)
Office Visit Note   Patient: Alejandro Ayers           Date of Birth: 1970/12/07           MRN: [id number] Visit Date: 01/25/2019              Requested by: Midge Minium, MD [address] [place],   [id number] PCP: Midge Minium, MD   Assessment & Plan: Visit Diagnoses:  1. Right shoulder pain, unspecified chronicity   2. Numbness and tingling in left hand   3. Impingement syndrome of right shoulder     Plan: Right shoulder subacromial injection performed for impingement.  He is not had any problems with overhead activity prior to the golf.  Will obtain some electrical test for bilateral carpal tunnel syndrome left greater than right.  Office follow-up after test.  Post shoulder injection had good range of motion and good relief of pain.  Follow-Up Instructions: followup after electrical tests for CTS  Orders:  Orders Placed This Encounter  Procedures  . Large Joint Inj  . XR Shoulder Right   No orders of the defined types were placed in this encounter.     Procedures: Large Joint Inj: R subacromial bursa on 01/25/2019 10:43 AM Indications: pain Details: 22 G 1.5 in needle  Arthrogram: No  Medications: 4 mL bupivacaine 0.25 %; 40 mg methylPREDNISolone acetate 40 MG/ML; 0.5 mL lidocaine 1 % Outcome: tolerated well, no immediate complications Procedure, treatment alternatives, risks and benefits explained, specific risks discussed. Consent was given by the patient. Immediately prior to procedure a time out was called to verify the correct patient, procedure, equipment, support staff and site/side marked as required. Patient was prepped and draped in the usual sterile fashion.       Clinical Data: No additional findings.   Subjective: Chief Complaint  Patient presents with  . Right Shoulder - Pain  . 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  He is noticed it with repetitive small activities with his hands with numbness.  He denies associated neck pain.  His other problem is right shoulder pain that occurred after he was playing golf on vacation.  Patient's been treated with Aleve for his left hand numbness.  He was placed on meloxicam for a month did not notice much difference he has been using a night splint on the recommendations of Dr. Birdie Riddle with persistent symptoms.  Review of Systems positive for hypertension, low T.  High cholesterol history of anemia.  Elbow tendinitis in the past.  Hand numbness and right shoulder impingement current.   Objective: Vital Signs: Ht 6' (1.829 m)   Wt 192 lb (87.1 kg)   BMI 26.04 kg/m   Physical Exam  Ortho Exam  Specialty Comments:  No specialty comments available.  Imaging: No results found.   PMFS History: Patient Active Problem List   Diagnosis Date Noted  . Right elbow tendinitis 06/12/2015  . Physical exam 12/18/2014  . Sty 12/18/2014  . Essential hypertension, benign 09/25/2012  . Low testosterone 09/25/2012  . Pure hypercholesterolemia 09/25/2012  . History of anemia 09/25/2012   Past Medical History:  Diagnosis Date  . Eczema   .  Hyperlipidemia   . Hypertension   . Tennis elbow    Had a cortisone injection in left elbow in the beginning of 2019    Family History  Problem Relation Age of Onset  . Hypertension Father   . CVA Sister        after head injury  . Prostate cancer Neg Hx   . Colon cancer Neg Hx     Past Surgical History:  Procedure Laterality Date  . GANGLION CYST EXCISION  11/22/08  . WISDOM TOOTH EXTRACTION     Social History   Occupational History  . Not on file  Tobacco Use  . Smoking status: Former Smoker    Packs/day: 1.00    Types: Cigarettes    Quit date: 09/13/2018    Years since  quitting: 0.3  . Smokeless tobacco: Never Used  Substance and Sexual Activity  . Alcohol use: Yes    Alcohol/week: 0.0 standard drinks  . Drug use: No  . Sexual activity: Yes

## 2019-02-09 ENCOUNTER — Encounter: Payer: Self-pay | Admitting: Physical Medicine and Rehabilitation

## 2019-02-09 ENCOUNTER — Other Ambulatory Visit: Payer: Self-pay

## 2019-02-09 ENCOUNTER — Ambulatory Visit (INDEPENDENT_AMBULATORY_CARE_PROVIDER_SITE_OTHER): Payer: Commercial Managed Care - PPO | Admitting: Physical Medicine and Rehabilitation

## 2019-02-09 DIAGNOSIS — R202 Paresthesia of skin: Secondary | ICD-10-CM | POA: Diagnosis not present

## 2019-02-09 NOTE — Progress Notes (Signed)
 .  Numeric Pain Rating Scale and Functional Assessment Average Pain 0   In the last MONTH (on 0-10 scale) has pain interfered with the following?  1. General activity like being  able to carry out your everyday physical activities such as walking, climbing stairs, carrying groceries, or moving a chair?  Rating(8)     

## 2019-02-14 NOTE — Procedures (Signed)
EMG & NCV Findings: Evaluation of the left median motor nerve showed prolonged distal onset latency (5.1 ms) and decreased conduction velocity (Elbow-Wrist, 48 m/s).  The left median (across palm) sensory nerve showed prolonged distal peak latency (Wrist, 4.8 ms) and prolonged distal peak latency (Palm, 2.1 ms).  The left radial sensory nerve showed prolonged distal peak latency (3.2 ms).  All remaining nerves (as indicated in the following tables) were within normal limits.    All examined muscles (as indicated in the following table) showed no evidence of electrical instability.    Impression: The above electrodiagnostic study is ABNORMAL and reveals evidence of a moderate left median nerve entrapment at the wrist (carpal tunnel syndrome) affecting sensory and motor components.   There is no significant electrodiagnostic evidence of any other focal nerve entrapment, brachial plexopathy or lumbosacral plexopathy  Recommendations: 1.  Follow-up with referring physician. 2.  Continue current management of symptoms. 3.  Continue use of resting splint at night-time and as needed during the day. 4.  Suggest surgical evaluation.  ___________________________ Laurence Spates FAAPMR Board Certified, American Board of Physical Medicine and Rehabilitation    Nerve Conduction Studies Anti Sensory Summary Table   Stim Site NR Peak (ms) Norm Peak (ms) P-T Amp (V) Norm P-T Amp Site1 Site2 Delta-P (ms) Dist (cm) Vel (m/s) Norm Vel (m/s)  Left Median Acr Palm Anti Sensory (2nd Digit)  32.5C  Wrist    *4.8 <3.6 16.2 >10 Wrist Palm 2.7 0.0    Palm    *2.1 <2.0 4.2         Left Radial Anti Sensory (Base 1st Digit)  33.1C  Wrist    *3.2 <3.1 18.0  Wrist Base 1st Digit 3.2 0.0    Left Ulnar Anti Sensory (5th Digit)  33.1C  Wrist    2.0 <3.7 22.6 >15.0 Wrist 5th Digit 2.0 14.0 70 >38   Motor Summary Table   Stim Site NR Onset (ms) Norm Onset (ms) O-P Amp (mV) Norm O-P Amp Site1 Site2 Delta-0 (ms) Dist  (cm) Vel (m/s) Norm Vel (m/s)  Left Median Motor (Abd Poll Brev)  33.1C  Wrist    *5.1 <4.2 6.8 >5 Elbow Wrist 4.6 22.0 *48 >50  Elbow    9.7  6.7         Left Ulnar Motor (Abd Dig Min)  32.9C  Wrist    2.8 <4.2 7.3 >3 B Elbow Wrist 3.8 23.0 61 >53  B Elbow    6.6  6.9  A Elbow B Elbow 1.5 10.0 67 >53  A Elbow    8.1  6.7          EMG   Side Muscle Nerve Root Ins Act Fibs Psw Amp Dur Poly Recrt Int Fraser Din Comment  Left Abd Poll Brev Median C8-T1 Nml Nml Nml Nml Nml 0 Nml Nml   Left 1stDorInt Ulnar C8-T1 Nml Nml Nml Nml Nml 0 Nml Nml   Left PronatorTeres Median C6-7 Nml Nml Nml Nml Nml 0 Nml Nml   Left Biceps Musculocut C5-6 Nml Nml Nml Nml Nml 0 Nml Nml   Left Deltoid Axillary C5-6 Nml Nml Nml Nml Nml 0 Nml Nml     Nerve Conduction Studies Anti Sensory Left/Right Comparison   Stim Site L Lat (ms) R Lat (ms) L-R Lat (ms) L Amp (V) R Amp (V) L-R Amp (%) Site1 Site2 L Vel (m/s) R Vel (m/s) L-R Vel (m/s)  Median Acr Palm Anti Sensory (2nd Digit)  32.[place]  Wrist *4.8   16.2   Wrist Palm     Palm *2.1   4.2         Radial Anti Sensory (Base 1st Digit)  33.1C  Wrist *3.2   18.0   Wrist Base 1st Digit     Ulnar Anti Sensory (5th Digit)  33.1C  Wrist 2.0   22.6   Wrist 5th Digit 70     Motor Left/Right Comparison   Stim Site L Lat (ms) R Lat (ms) L-R Lat (ms) L Amp (mV) R Amp (mV) L-R Amp (%) Site1 Site2 L Vel (m/s) R Vel (m/s) L-R Vel (m/s)  Median Motor (Abd Poll Brev)  33.1C  Wrist *5.1   6.8   Elbow Wrist *48    Elbow 9.7   6.7         Ulnar Motor (Abd Dig Min)  32.9C  Wrist 2.8   7.3   B Elbow Wrist 61    B Elbow 6.6   6.9   A Elbow B Elbow 67    A Elbow 8.1   6.7            Waveforms:

## 2019-02-14 NOTE — Progress Notes (Signed)
Alejandro Ayers - 48 y.o. male MRN [id number]  Date of birth: Jul 24, 1970  Office Visit Note: Visit Date: 02/09/2019 PCP: Midge Minium, MD Referred by: Midge Minium, MD  Subjective: Chief Complaint  Patient presents with   Left Forearm - Numbness, Tingling   Left Hand - Tingling, Numbness   HPI:  Alejandro Ayers is a 48 y.o. male who comes in today At the request of Dr. Rodell Perna for electrodiagnostic studies of the left upper limb.  Patient is left-hand dominant and reports 6 to 8 months of worsening symptoms consisting of numbness and tingling in the left forearm and left hand in a global fashion in all of the digits.  If pressed he says it is more of the radial 3 digits.  He reports using his left hand makes his symptoms worse.  He has some right shoulder pain but no left radicular pain or neck pain.  He has had no focal weakness.  He has had no prior electrodiagnostic studies.  He is not diabetic.  ROS Otherwise per HPI.  Assessment & Plan: Visit Diagnoses:  1. Paresthesia of skin     Plan: Impression: The above electrodiagnostic study is ABNORMAL and reveals evidence of a moderate left median nerve entrapment at the wrist (carpal tunnel syndrome) affecting sensory and motor components.   There is no significant electrodiagnostic evidence of any other focal nerve entrapment, brachial plexopathy or lumbosacral plexopathy  Recommendations: 1.  Follow-up with referring physician. 2.  Continue current management of symptoms. 3.  Continue use of resting splint at night-time and as needed during the day. 4.  Suggest surgical evaluation.  Meds & Orders: No orders of the defined types were placed in this encounter.   Orders Placed This Encounter  Procedures   NCV with EMG (electromyography)    Follow-up: Return for Rodell Perna, M.D..   Procedures: No procedures performed  EMG & NCV Findings: Evaluation of the left median motor nerve showed prolonged distal  onset latency (5.1 ms) and decreased conduction velocity (Elbow-Wrist, 48 m/s).  The left median (across palm) sensory nerve showed prolonged distal peak latency (Wrist, 4.8 ms) and prolonged distal peak latency (Palm, 2.1 ms).  The left radial sensory nerve showed prolonged distal peak latency (3.2 ms).  All remaining nerves (as indicated in the following tables) were within normal limits.    All examined muscles (as indicated in the following table) showed no evidence of electrical instability.    Impression: The above electrodiagnostic study is ABNORMAL and reveals evidence of a moderate left median nerve entrapment at the wrist (carpal tunnel syndrome) affecting sensory and motor components.   There is no significant electrodiagnostic evidence of any other focal nerve entrapment, brachial plexopathy or lumbosacral plexopathy  Recommendations: 1.  Follow-up with referring physician. 2.  Continue current management of symptoms. 3.  Continue use of resting splint at night-time and as needed during the day. 4.  Suggest surgical evaluation.  ___________________________ Laurence Spates FAAPMR Board Certified, American Board of Physical Medicine and Rehabilitation    Nerve Conduction Studies Anti Sensory Summary Table   Stim Site NR Peak (ms) Norm Peak (ms) P-T Amp (V) Norm P-T Amp Site1 Site2 Delta-P (ms) Dist (cm) Vel (m/s) Norm Vel (m/s)  Left Median Acr Palm Anti Sensory (2nd Digit)  32.5C  Wrist    *4.8 <3.6 16.2 >10 Wrist Palm 2.7 0.0    Palm    *2.1 <2.0 4.2  Left Radial Anti Sensory (Base 1st Digit)  33.1C  Wrist    *3.2 <3.1 18.0  Wrist Base 1st Digit 3.2 0.0    Left Ulnar Anti Sensory (5th Digit)  33.1C  Wrist    2.0 <3.7 22.6 >15.0 Wrist 5th Digit 2.0 14.0 70 >38   Motor Summary Table   Stim Site NR Onset (ms) Norm Onset (ms) O-P Amp (mV) Norm O-P Amp Site1 Site2 Delta-0 (ms) Dist (cm) Vel (m/s) Norm Vel (m/s)  Left Median Motor (Abd Poll Brev)  33.1C  Wrist     *5.1 <4.2 6.8 >5 Elbow Wrist 4.6 22.0 *48 >50  Elbow    9.7  6.7         Left Ulnar Motor (Abd Dig Min)  32.9C  Wrist    2.8 <4.2 7.3 >3 B Elbow Wrist 3.8 23.0 61 >53  B Elbow    6.6  6.9  A Elbow B Elbow 1.5 10.0 67 >53  A Elbow    8.1  6.7          EMG   Side Muscle Nerve Root Ins Act Fibs Psw Amp Dur Poly Recrt Int Fraser Din Comment  Left Abd Poll Brev Median C8-T1 Nml Nml Nml Nml Nml 0 Nml Nml   Left 1stDorInt Ulnar C8-T1 Nml Nml Nml Nml Nml 0 Nml Nml   Left PronatorTeres Median C6-7 Nml Nml Nml Nml Nml 0 Nml Nml   Left Biceps Musculocut C5-6 Nml Nml Nml Nml Nml 0 Nml Nml   Left Deltoid Axillary C5-6 Nml Nml Nml Nml Nml 0 Nml Nml     Nerve Conduction Studies Anti Sensory Left/Right Comparison   Stim Site L Lat (ms) R Lat (ms) L-R Lat (ms) L Amp (V) R Amp (V) L-R Amp (%) Site1 Site2 L Vel (m/s) R Vel (m/s) L-R Vel (m/s)  Median Acr Palm Anti Sensory (2nd Digit)  32.5C  Wrist *4.8   16.2   Wrist Palm     Palm *2.1   4.2         Radial Anti Sensory (Base 1st Digit)  33.1C  Wrist *3.2   18.0   Wrist Base 1st Digit     Ulnar Anti Sensory (5th Digit)  33.1C  Wrist 2.0   22.6   Wrist 5th Digit 70     Motor Left/Right Comparison   Stim Site L Lat (ms) R Lat (ms) L-R Lat (ms) L Amp (mV) R Amp (mV) L-R Amp (%) Site1 Site2 L Vel (m/s) R Vel (m/s) L-R Vel (m/s)  Median Motor (Abd Poll Brev)  33.1C  Wrist *5.1   6.8   Elbow Wrist *48    Elbow 9.7   6.7         Ulnar Motor (Abd Dig Min)  32.9C  Wrist 2.8   7.3   B Elbow Wrist 61    B Elbow 6.6   6.9   A Elbow B Elbow 67    A Elbow 8.1   6.7            Waveforms:             Clinical History: No specialty comments available.     Objective:  VS:  HT:     WT:    BMI:      BP:    HR: bpm   TEMP: ( )   RESP:  Physical Exam  Ortho Exam Imaging: No results found.

## 2019-02-15 ENCOUNTER — Encounter: Payer: Self-pay | Admitting: Orthopaedic Surgery

## 2019-02-15 ENCOUNTER — Ambulatory Visit (INDEPENDENT_AMBULATORY_CARE_PROVIDER_SITE_OTHER): Payer: Commercial Managed Care - PPO | Admitting: Orthopaedic Surgery

## 2019-02-15 DIAGNOSIS — G5602 Carpal tunnel syndrome, left upper limb: Secondary | ICD-10-CM | POA: Diagnosis not present

## 2019-02-15 DIAGNOSIS — M7541 Impingement syndrome of right shoulder: Secondary | ICD-10-CM | POA: Diagnosis not present

## 2019-02-15 NOTE — Progress Notes (Signed)
Office Visit Note   Patient: Alejandro Ayers           Date of Birth: 1971/07/20           MRN: [id number] Visit Date: 02/15/2019              Requested by: Midge Minium, MD [address] [place],  [place] [address] PCP: Midge Minium, MD   Assessment & Plan: Visit Diagnoses:  1. Carpal tunnel syndrome, left upper limb   2. Impingement syndrome of right shoulder     Plan: Patient is having daily significant pain from his left carpal tunnel syndrome.  He like to proceed with this and we will defer evaluation of his right shoulder MRI till after his carpal tunnel release procedure.  Follow-Up Instructions: pre-op for left CTR  Orders:  No orders of the defined types were placed in this encounter.  No orders of the defined types were placed in this encounter.     Procedures: No procedures performed   Clinical Data: No additional findings.   Subjective: Chief Complaint  Patient presents with  . Left Wrist - Pain, Follow-up    NCS Review  . Right Shoulder - Pain, Follow-up    HPI 48 year old male returns with persistent problems with left wrist pain and has had nerve conduction velocities done for carpal tunnel syndrome.  He states he continues to have some problems with his right shoulder and had an injection 01/25/2019 which did not give him any relief.  Patient has used anti-inflammatories and night splinting without relief.  Review of Systems positive for hypertension, low T.  Positive for hypercholesterolemia.  Elbow tendinitis.  Right shoulder impingement and left greater than right hand numbness median nerve distribution , carpal tunnel syndrome.   Objective: Vital Signs: Ht 6' (1.829 m)   Wt 192 lb (87.1 kg)   BMI 26.04 kg/m   Physical Exam Constitutional:      Appearance: He is well-developed.  HENT:     Head: Normocephalic and atraumatic.  Eyes:     Pupils: Pupils are equal, round, and reactive to light.  Neck:     Thyroid: No  thyromegaly.     Trachea: No tracheal deviation.  Cardiovascular:     Rate and Rhythm: Normal rate.  Pulmonary:     Effort: Pulmonary effort is normal.     Breath sounds: No wheezing.  Abdominal:     General: Bowel sounds are normal.     Palpations: Abdomen is soft.  Skin:    General: Skin is warm and dry.     Capillary Refill: Capillary refill takes less than 2 seconds.  Neurological:     Mental Status: He is alert and oriented to person, place, and time.  Psychiatric:        Behavior: Behavior normal.        Thought Content: Thought content normal.        Judgment: Judgment normal.     Ortho Exam positive impingement right shoulder.  Positive Phalen's left greater than right wrist.  Ulnar nerve at the elbow is normal.  Specialty Comments:  EMGs NCV left wrist is abnormal and shows moderate median nerve entrapment at the wrist consistent with carpal tunnel syndrome affecting sensory and motor components.  Imaging: No results found.   PMFS History: Patient Active Problem List   Diagnosis Date Noted  . Carpal tunnel syndrome, left upper limb 02/20/2019  . Impingement syndrome of right shoulder 02/20/2019  .  Bilateral hand numbness 01/25/2019  . Right elbow tendinitis 06/12/2015  . Physical exam 12/18/2014  . Sty 12/18/2014  . Essential hypertension, benign 09/25/2012  . Low testosterone 09/25/2012  . Pure hypercholesterolemia 09/25/2012  . History of anemia 09/25/2012   Past Medical History:  Diagnosis Date  . Eczema   . Hyperlipidemia   . Hypertension   . Tennis elbow    Had a cortisone injection in left elbow in the beginning of 2019    Family History  Problem Relation Age of Onset  . Hypertension Father   . CVA Sister        after head injury  . Prostate cancer Neg Hx   . Colon cancer Neg Hx     Past Surgical History:  Procedure Laterality Date  . GANGLION CYST EXCISION  11/22/08  . WISDOM TOOTH EXTRACTION     Social History   Occupational History   . Not on file  Tobacco Use  . Smoking status: Former Smoker    Packs/day: 1.00    Types: Cigarettes    Quit date: 09/13/2018    Years since quitting: 0.4  . Smokeless tobacco: Never Used  Substance and Sexual Activity  . Alcohol use: Yes    Alcohol/week: 0.0 standard drinks  . Drug use: No  . Sexual activity: Yes

## 2019-02-20 DIAGNOSIS — M7541 Impingement syndrome of right shoulder: Secondary | ICD-10-CM | POA: Insufficient documentation

## 2019-02-20 DIAGNOSIS — G5602 Carpal tunnel syndrome, left upper limb: Secondary | ICD-10-CM | POA: Insufficient documentation

## 2019-02-26 ENCOUNTER — Encounter: Payer: Self-pay | Admitting: Family Medicine

## 2019-02-28 ENCOUNTER — Other Ambulatory Visit: Payer: Self-pay | Admitting: Family Medicine

## 2019-02-28 ENCOUNTER — Other Ambulatory Visit: Payer: Self-pay | Admitting: Orthopaedic Surgery

## 2019-02-28 DIAGNOSIS — G5602 Carpal tunnel syndrome, left upper limb: Secondary | ICD-10-CM

## 2019-02-28 MED ORDER — HYDROCODONE-ACETAMINOPHEN 5-325 MG PO TABS: 1.0000 | ORAL_TABLET | Freq: Four times a day (QID) | ORAL | 0 refills | Status: DC | PRN

## 2019-02-28 MED ORDER — SCOPOLAMINE 1 MG/3DAYS TD PT72: 1.0000 | MEDICATED_PATCH | TRANSDERMAL | 1 refills | Status: DC

## 2019-02-28 NOTE — Progress Notes (Unsigned)
Post op pain left CTR

## 2019-03-09 ENCOUNTER — Other Ambulatory Visit: Payer: Self-pay

## 2019-03-09 ENCOUNTER — Encounter: Payer: Self-pay | Admitting: Orthopaedic Surgery

## 2019-03-09 ENCOUNTER — Ambulatory Visit (INDEPENDENT_AMBULATORY_CARE_PROVIDER_SITE_OTHER): Payer: Commercial Managed Care - PPO | Admitting: Orthopaedic Surgery

## 2019-03-09 VITALS — BP 132/80 | HR 85 | Ht 72.0 in | Wt 192.0 lb

## 2019-03-09 DIAGNOSIS — G5602 Carpal tunnel syndrome, left upper limb: Secondary | ICD-10-CM

## 2019-03-09 NOTE — Progress Notes (Signed)
Postop carpal tunnel release, left.  Incision looks good he does not have a wrist splint currently one was applied he can use intermittently and remove it for bathing return in 1 week for suture removal.  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

## 2019-03-16 ENCOUNTER — Ambulatory Visit (INDEPENDENT_AMBULATORY_CARE_PROVIDER_SITE_OTHER): Payer: Commercial Managed Care - PPO | Admitting: Orthopaedic Surgery

## 2019-03-16 ENCOUNTER — Encounter: Payer: Self-pay | Admitting: Orthopaedic Surgery

## 2019-03-16 VITALS — BP 136/80 | HR 80 | Ht 72.0 in | Wt 192.0 lb

## 2019-03-16 DIAGNOSIS — G5602 Carpal tunnel syndrome, left upper limb: Secondary | ICD-10-CM

## 2019-03-16 NOTE — Progress Notes (Signed)
Post left carpal tunnel release.  Sutures are harvested today incision looks good he is noticed improvement in sensation his fingers and is sleeping better.  He still has some problems with the right shoulder had previous injection and he will call if he like to proceed with MRI scan with persistent decreased range of motion soreness stiffness and discomfort with outstretch reaching with positive Neer test on the right shoulder.  Opposite right hand has minimal carpal tunnel symptoms and he will call if he has any progression on the right side.  He is happy with the results of carpal tunnel release and he will call about the shoulder if he gets increased problems and wants to proceed with MRI imaging.

## 2019-03-21 ENCOUNTER — Telehealth: Payer: Self-pay | Admitting: Orthopaedic Surgery

## 2019-03-21 DIAGNOSIS — M7541 Impingement syndrome of right shoulder: Secondary | ICD-10-CM

## 2019-03-21 NOTE — Telephone Encounter (Signed)
Patient called stated he was told by Dr.Yates that he would need an MRI on right shoulder and for him to call when ready to have it.  No order in. Please call pt to advise. [phone]

## 2019-03-21 NOTE — Telephone Encounter (Signed)
Order entered after reviewing last office note from Dr Lorin Mercy. Patient aware.

## 2019-04-11 ENCOUNTER — Telehealth: Payer: Self-pay | Admitting: Orthopaedic Surgery

## 2019-04-11 ENCOUNTER — Encounter: Payer: Self-pay | Admitting: Orthopaedic Surgery

## 2019-04-11 NOTE — Telephone Encounter (Signed)
Note faxed.

## 2019-04-11 NOTE — Telephone Encounter (Signed)
[place] [facility]  Architectural technologist, fax to Attention [facility]   [phone]   Thanks

## 2019-04-11 NOTE — Telephone Encounter (Signed)
Please advise on return to work.

## 2019-04-11 NOTE — Telephone Encounter (Signed)
Patient came into the clinic to get a return to work note.  Patient was advised that the provider was not in the office and that they will call him when the note is ready for pick up.  CB#[phone].  Thank you.

## 2019-04-12 NOTE — Telephone Encounter (Signed)
Patient came to the office today stating work note needs to specify no restrictions. New note given to pt to take to work.

## 2019-04-14 ENCOUNTER — Other Ambulatory Visit: Payer: Self-pay | Admitting: Family Medicine

## 2019-04-14 ENCOUNTER — Ambulatory Visit
Admission: RE | Admit: 2019-04-14 | Discharge: 2019-04-14 | Disposition: A | Discharge status: Home / Self Care | Payer: Commercial Managed Care - PPO | Source: Ambulatory Visit | Attending: Orthopaedic Surgery | Admitting: Orthopaedic Surgery

## 2019-04-14 ENCOUNTER — Other Ambulatory Visit: Payer: Self-pay

## 2019-04-14 DIAGNOSIS — M7541 Impingement syndrome of right shoulder: Secondary | ICD-10-CM

## 2019-04-18 ENCOUNTER — Telehealth: Payer: Self-pay | Admitting: Orthopaedic Surgery

## 2019-04-18 NOTE — Telephone Encounter (Signed)
Patient would like to proceed with his right shoulder surgery.  He has also requested that we hold a date of November 2nd for him.  Please provide surgery sheet.

## 2019-04-19 NOTE — Telephone Encounter (Signed)
Holding for you. I have called and LM for patient to let him know Dr Lorin Mercy out of clinic.

## 2019-04-21 NOTE — Telephone Encounter (Signed)
Could you please tell me what you would like on blue sheet and I will be glad to complete.

## 2019-04-26 NOTE — Telephone Encounter (Signed)
Please see below response from Dr. Lorin Mercy.

## 2019-04-26 NOTE — Telephone Encounter (Signed)
Cannot get approval for surgery without ROV and dictation , MRI review and discussion about surgery. Cannot have in nov 2nd . Can see him as soon as I am back and then get him scheduled. His insurance will not approve without that note. ucall thanks  Make him appt as soon as I get back , he will need shoulder scope debridement of biceps and SLAP tear, open RC repair and biceps tenodesis.

## 2019-05-24 ENCOUNTER — Other Ambulatory Visit: Payer: Self-pay

## 2019-05-24 ENCOUNTER — Ambulatory Visit: Payer: Commercial Managed Care - PPO | Admitting: Orthopaedic Surgery

## 2019-05-24 ENCOUNTER — Encounter: Payer: Self-pay | Admitting: Orthopaedic Surgery

## 2019-05-24 DIAGNOSIS — S43431A Superior glenoid labrum lesion of right shoulder, initial encounter: Secondary | ICD-10-CM | POA: Diagnosis not present

## 2019-05-24 DIAGNOSIS — M75101 Unspecified rotator cuff tear or rupture of right shoulder, not specified as traumatic: Secondary | ICD-10-CM | POA: Diagnosis not present

## 2019-05-24 DIAGNOSIS — M75100 Unspecified rotator cuff tear or rupture of unspecified shoulder, not specified as traumatic: Secondary | ICD-10-CM | POA: Insufficient documentation

## 2019-05-24 DIAGNOSIS — M67813 Other specified disorders of tendon, right shoulder: Secondary | ICD-10-CM | POA: Diagnosis not present

## 2019-05-24 NOTE — Progress Notes (Signed)
Office Visit Note   Patient: Alejandro Ayers           Date of Birth: 05-09-1971           MRN: [id number] Visit Date: 05/24/2019              Requested by: Midge Minium, MD [address] [place],  [place] [address] PCP: Midge Minium, MD   Assessment & Plan: Visit Diagnoses:  1. Biceps tendonosis of right shoulder   2. Superior labrum anterior-to-posterior (SLAP) tear of right shoulder   3. Tear of right supraspinatus tendon     Plan: We reviewed the MRI scan I discussed pathophysiology.  Patient like to proceed with surgical intervention we discussed he is likely get a be out of work for about 8 weeks.  He would require SLAP debridement debridement of the biceps tendon biceps tenodesis and likely supraspinatus repair with extensive tendinosis.  Infraspinatus may need to be repaired as well since there is tendinosis but it does not appear as severe at the supraspinatus tendon.  Most of his pain is likely coming from the partial tearing of the biceps tendon with tendinosis and SLAP tear.  Plan would be shoulder arthroscopy rotator cuff repair as needed and biceps tenodesis.  Procedure discussed postoperative mobilization a sling interscalene block to help with the pain as well as local anesthesia.  Outpatient procedure with only possible overnight stay if there is a problem with the block and pain was not tolerated.  Questions were elicited and answered he understands request to proceed.  Decision for surgery was made today informed consent obtained.  Follow-Up Instructions: No follow-ups on file.   Orders:  No orders of the defined types were placed in this encounter.  No orders of the defined types were placed in this encounter.     Procedures: No procedures performed   Clinical Data: No additional findings.   Subjective: Chief Complaint  Patient presents with  . Right Shoulder - Pain, Follow-up    HPI 48 year old male returns with persistent problems  with his right shoulder.  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  He has increased pain in his right shoulder pain with outstretch reaching and has been through exercise program, taken anti-inflammatories and also had injection with cortisone without relief.  Ultimately MRI scan is been obtained which shows a superior labral tear with extensive biceps tendinosis intra-articularly as well as supraspinatus partial tearing with extensive tendinosis worse in the supraspinatus than infraspinatus.  Subscap and teres minor are normal.  Patient bothers him with activities outstretch reaching overhead activity points directly anteriorly over the shoulder and has abduction only to 90 degrees.  Patient states he is ready to proceed with surgery.    Review of Systems 14 point review of systems updated unchanged from 02/15/2019 office visit.  Previous carpal tunnel release left hand doing well good relief of symptoms.  Persistent right shoulder pain.   Objective: Vital Signs: BP 125/75   Pulse 88   Ht 6' (1.829 m)   Wt 195 lb (88.5 kg)   BMI 26.45 kg/m   Physical Exam Constitutional:      Appearance: He is well-developed.  HENT:     Head: Normocephalic and atraumatic.  Eyes:     Pupils: Pupils are equal, round, and reactive to light.  Neck:     Thyroid: No  thyromegaly.     Trachea: No tracheal deviation.  Cardiovascular:     Rate and Rhythm: Normal rate.  Pulmonary:     Effort: Pulmonary effort is normal.     Breath sounds: No wheezing.  Abdominal:     General: Bowel sounds are normal.     Palpations: Abdomen is soft.  Skin:    General: Skin is warm and dry.     Capillary Refill: Capillary refill takes less than 2 seconds.  Neurological:     Mental Status: He is alert and oriented to person, place, and time.  Psychiatric:        Behavior: Behavior normal.        Thought Content: Thought content normal.         Judgment: Judgment normal.     Ortho Exam positive impingement right shoulder.  Pain anteriorly over the long head of the biceps tendon in the groove.  Positive reproduction of pain with drop arm test.  Subscap external rotation is strong.  Pain with resisted abduction.  No anterior subluxation.  Positive speeds test.  Opposite left shoulder shows good range of motion no impingement.  Specialty Comments:  No specialty comments available.  Imaging: Study Result  CLINICAL DATA:  Injury playing golf Jan 2020 and again July 2020 causing onset of right shoulder joint pain worse with abduction, right arm weakness.  EXAM: MRI OF THE RIGHT SHOULDER WITHOUT CONTRAST  TECHNIQUE: Multiplanar, multisequence MR imaging of the shoulder was performed. No intravenous contrast was administered.  COMPARISON:  None.  FINDINGS: Rotator cuff: Severe tendinosis of the supraspinatus tendon. Infraspinatus tendon is intact. Teres minor tendon is intact. Subscapularis tendon is intact.  Muscles: No atrophy or abnormal signal of the muscles of the rotator cuff.  Biceps long head: Severe tendinosis of the intraarticular portion of the long head of the biceps tendon.  Acromioclavicular Joint: Moderate arthropathy of the acromioclavicular joint. Type II acromion. Trace subacromial/subdeltoid bursal fluid.  Glenohumeral Joint: No joint effusion. No chondral defect.  Labrum: Superior labral tear from anterior to posterior with extension into the anterior labrum.  Bones:  No acute osseous abnormality.  No aggressive osseous lesion.  Other: No fluid collection or hematoma.  IMPRESSION: 1. Severe tendinosis of the supraspinatus tendon. 2. Severe tendinosis of the intraarticular portion of the long head of the biceps tendon. 3. Superior labral tear from anterior to posterior with extension into the anterior labrum.   Electronically Signed   By: Kathreen Devoid   On: 04/14/2019  12:13       PMFS History: Patient Active Problem List   Diagnosis Date Noted  . Biceps tendonosis of right shoulder 05/24/2019  . Superior labrum anterior-to-posterior (SLAP) tear of right shoulder 05/24/2019  . Supraspinatus tendon tear 05/24/2019  . Carpal tunnel syndrome, left upper limb 02/20/2019  . Impingement syndrome of right shoulder 02/20/2019  . Bilateral hand numbness 01/25/2019  . Right elbow tendinitis 06/12/2015  . Physical exam 12/18/2014  . Sty 12/18/2014  . Essential hypertension, benign 09/25/2012  . Low testosterone 09/25/2012  . Pure hypercholesterolemia 09/25/2012  . History of anemia 09/25/2012   Past Medical History:  Diagnosis Date  . Eczema   . Hyperlipidemia   . Hypertension   . Tennis elbow    Had a cortisone injection in left elbow in the beginning of 2019    Family History  Problem Relation Age of Onset  . Hypertension Father   . CVA Sister  after head injury  . Prostate cancer Neg Hx   . Colon cancer Neg Hx     Past Surgical History:  Procedure Laterality Date  . GANGLION CYST EXCISION  11/22/08  . WISDOM TOOTH EXTRACTION     Social History   Occupational History  . Not on file  Tobacco Use  . Smoking status: Former Smoker    Packs/day: 1.00    Types: Cigarettes    Quit date: 09/13/2018    Years since quitting: 0.6  . Smokeless tobacco: Never Used  Substance and Sexual Activity  . Alcohol use: Yes    Alcohol/week: 0.0 standard drinks  . Drug use: No  . Sexual activity: Yes

## 2019-05-27 ENCOUNTER — Other Ambulatory Visit: Payer: Self-pay | Admitting: Orthopaedic Surgery

## 2019-05-27 DIAGNOSIS — S43431A Superior glenoid labrum lesion of right shoulder, initial encounter: Secondary | ICD-10-CM

## 2019-05-27 DIAGNOSIS — M7521 Bicipital tendinitis, right shoulder: Secondary | ICD-10-CM

## 2019-05-27 DIAGNOSIS — M75111 Incomplete rotator cuff tear or rupture of right shoulder, not specified as traumatic: Secondary | ICD-10-CM

## 2019-05-27 MED ORDER — OXYCODONE-ACETAMINOPHEN 5-325 MG PO TABS: 1.0000 | ORAL_TABLET | ORAL | 0 refills | Status: DC | PRN

## 2019-05-27 NOTE — Progress Notes (Signed)
Percocet for post op pain RC biceps tenodesis

## 2019-06-03 ENCOUNTER — Other Ambulatory Visit: Payer: Self-pay

## 2019-06-03 ENCOUNTER — Ambulatory Visit (INDEPENDENT_AMBULATORY_CARE_PROVIDER_SITE_OTHER): Payer: Commercial Managed Care - PPO | Admitting: Orthopaedic Surgery

## 2019-06-03 ENCOUNTER — Encounter: Payer: Self-pay | Admitting: Orthopaedic Surgery

## 2019-06-03 VITALS — BP 135/79 | HR 101 | Ht 72.0 in | Wt 195.0 lb

## 2019-06-03 DIAGNOSIS — M67813 Other specified disorders of tendon, right shoulder: Secondary | ICD-10-CM

## 2019-06-03 NOTE — Progress Notes (Signed)
   Post-Op Visit Note   Patient: Alejandro Ayers           Date of Birth: 20-Sep-1970           MRN: [id number] Visit Date: 06/03/2019 PCP: Midge Minium, MD   Assessment & Plan: Tach and post right shoulder arthroscopy with biceps tenodesis and small rotator cuff repair with single anchor.  He is not been using his sling much.  We discussed the superior labral tear and debridement that was performed with biceps tenodesis.  He is going to be out until Christmas time work slip given.  Sutures removed Steri-Strips changed.  He can start moving his shoulder in about 3 weeks and gradually progress with activities.  I plan to check him again in 4 weeks.  Chief Complaint:  Chief Complaint  Patient presents with  . Right Shoulder - Routine Post Op    05/27/2019 Right Shoulder Arthroscopy, Debridement, Biceps Tenodesis, Open RCR   Visit Diagnoses:  1. Biceps tendonosis of right shoulder     Plan: Continue sling he can remove it for bathing.  Operative findings discussed.  Recheck 4 weeks.  Work slip given no work till Christmas.  He can resume regular work at that time.  Follow-Up Instructions: Return in about 4 weeks (around 07/01/2019).   Orders:  No orders of the defined types were placed in this encounter.  No orders of the defined types were placed in this encounter.   Imaging: No results found.  PMFS History: Patient Active Problem List   Diagnosis Date Noted  . Biceps tendonosis of right shoulder 05/24/2019  . Superior labrum anterior-to-posterior (SLAP) tear of right shoulder 05/24/2019  . Supraspinatus tendon tear 05/24/2019  . Carpal tunnel syndrome, left upper limb 02/20/2019  . Impingement syndrome of right shoulder 02/20/2019  . Bilateral hand numbness 01/25/2019  . Right elbow tendinitis 06/12/2015  . Physical exam 12/18/2014  . Sty 12/18/2014  . Essential hypertension, benign 09/25/2012  . Low testosterone 09/25/2012  . Pure hypercholesterolemia 09/25/2012   . History of anemia 09/25/2012   Past Medical History:  Diagnosis Date  . Eczema   . Hyperlipidemia   . Hypertension   . Tennis elbow    Had a cortisone injection in left elbow in the beginning of 2019    Family History  Problem Relation Age of Onset  . Hypertension Father   . CVA Sister        after head injury  . Prostate cancer Neg Hx   . Colon cancer Neg Hx     Past Surgical History:  Procedure Laterality Date  . GANGLION CYST EXCISION  11/22/08  . WISDOM TOOTH EXTRACTION     Social History   Occupational History  . Not on file  Tobacco Use  . Smoking status: Former Smoker    Packs/day: 1.00    Types: Cigarettes    Quit date: 09/13/2018    Years since quitting: 0.7  . Smokeless tobacco: Never Used  Substance and Sexual Activity  . Alcohol use: Yes    Alcohol/week: 0.0 standard drinks  . Drug use: No  . Sexual activity: Yes

## 2019-06-29 ENCOUNTER — Encounter: Payer: Self-pay | Admitting: Family Medicine

## 2019-06-29 ENCOUNTER — Ambulatory Visit (INDEPENDENT_AMBULATORY_CARE_PROVIDER_SITE_OTHER): Payer: Commercial Managed Care - PPO | Admitting: Orthopaedic Surgery

## 2019-06-29 ENCOUNTER — Ambulatory Visit (INDEPENDENT_AMBULATORY_CARE_PROVIDER_SITE_OTHER): Payer: Commercial Managed Care - PPO | Admitting: Family Medicine

## 2019-06-29 ENCOUNTER — Other Ambulatory Visit: Payer: Self-pay

## 2019-06-29 ENCOUNTER — Encounter: Payer: Self-pay | Admitting: Orthopaedic Surgery

## 2019-06-29 VITALS — BP 132/72 | HR 90 | Temp 98.7°F | Ht 72.0 in | Wt 195.0 lb

## 2019-06-29 VITALS — BP 132/72 | HR 90 | Ht 72.0 in | Wt 195.0 lb

## 2019-06-29 DIAGNOSIS — M75101 Unspecified rotator cuff tear or rupture of right shoulder, not specified as traumatic: Secondary | ICD-10-CM

## 2019-06-29 DIAGNOSIS — I10 Essential (primary) hypertension: Secondary | ICD-10-CM

## 2019-06-29 DIAGNOSIS — M67813 Other specified disorders of tendon, right shoulder: Secondary | ICD-10-CM

## 2019-06-29 DIAGNOSIS — S43431A Superior glenoid labrum lesion of right shoulder, initial encounter: Secondary | ICD-10-CM

## 2019-06-29 DIAGNOSIS — Z0000 Encounter for general adult medical examination without abnormal findings: Secondary | ICD-10-CM

## 2019-06-29 DIAGNOSIS — Z125 Encounter for screening for malignant neoplasm of prostate: Secondary | ICD-10-CM | POA: Diagnosis not present

## 2019-06-29 NOTE — Progress Notes (Signed)
I have discussed the procedure for the virtual visit with the patient who has given consent to proceed with assessment and treatment.   Bernadette Gores L Anahid Eskelson, CMA     

## 2019-06-29 NOTE — Progress Notes (Signed)
   Post-Op Visit Note   Patient: Alejandro Ayers           Date of Birth: 1970/10/21           MRN: [id number] Visit Date: 06/29/2019 PCP: Midge Minium, MD   Assessment & Plan: Patient turns post right shoulder arthroscopy rotator cuff repair single anchor and biceps tenodesis.  Incisions look good he is been using his sling he is now between 5 and 6 weeks out.  We will start some physical therapy but avoid biceps pulling for a few weeks.  Main goal is to work on passive range of motion of his shoulder and active assistive range of motion of his shoulder.  We discussed lying down using a stick in both hands as he lays down and gradually putting his arms up over his head.  We discussed wall walking with his fingertips.  We discussed throwing a rope over door he could use it to soften down to help with range of motion.  Recheck 4 weeks just before New Year's.  He is currently covered to go back to work on July 25, 2019.  Chief Complaint:  Chief Complaint  Patient presents with  . Right Shoulder - Follow-up    05/27/2019 Right Shoulder Arthroscopy, Debridement, Biceps Tenodesis, Open RCR   Visit Diagnoses: Post right shoulder rotator cuff repair biceps tenodesis.  Plan: Start therapy recheck 4 weeks.  Follow-Up Instructions: No follow-ups on file.   Orders:  No orders of the defined types were placed in this encounter.  No orders of the defined types were placed in this encounter.   Imaging: No results found.  PMFS History: Patient Active Problem List   Diagnosis Date Noted  . Biceps tendonosis of right shoulder 05/24/2019  . Superior labrum anterior-to-posterior (SLAP) tear of right shoulder 05/24/2019  . Supraspinatus tendon tear 05/24/2019  . Carpal tunnel syndrome, left upper limb 02/20/2019  . Impingement syndrome of right shoulder 02/20/2019  . Bilateral hand numbness 01/25/2019  . Right elbow tendinitis 06/12/2015  . Physical exam 12/18/2014  . Sty 12/18/2014  .  Essential hypertension, benign 09/25/2012  . Low testosterone 09/25/2012  . Pure hypercholesterolemia 09/25/2012  . History of anemia 09/25/2012   Past Medical History:  Diagnosis Date  . Eczema   . Hyperlipidemia   . Hypertension   . Tennis elbow    Had a cortisone injection in left elbow in the beginning of 2019    Family History  Problem Relation Age of Onset  . Hypertension Father   . CVA Sister        after head injury  . Prostate cancer Neg Hx   . Colon cancer Neg Hx     Past Surgical History:  Procedure Laterality Date  . GANGLION CYST EXCISION  11/22/08  . WISDOM TOOTH EXTRACTION     Social History   Occupational History  . Not on file  Tobacco Use  . Smoking status: Former Smoker    Packs/day: 1.00    Types: Cigarettes    Quit date: 09/13/2018    Years since quitting: 0.7  . Smokeless tobacco: Never Used  Substance and Sexual Activity  . Alcohol use: Yes    Alcohol/week: 0.0 standard drinks  . Drug use: No  . Sexual activity: Yes

## 2019-06-29 NOTE — Addendum Note (Signed)
Addended by: Meyer Cory on: 06/29/2019 11:17 AM   Modules accepted: Orders

## 2019-06-29 NOTE — Progress Notes (Signed)
   Virtual Visit via Video   I connected with patient on 06/29/19 at  3:00 PM EST by a video enabled telemedicine application and verified that I am speaking with the correct person using two identifiers.  Location patient: Home Location provider: Acupuncturist, Office Persons participating in the virtual visit: Patient, Provider, [place] (Jess B)  I discussed the limitations of evaluation and management by telemedicine and the availability of in person appointments. The patient expressed understanding and agreed to proceed.  Subjective:   HPI:   CPE- pt is [facility].  Exercise has been somewhat limited w/ shoulder surgery but still walking regularly.  No concerns today  ROS:  Patient reports no vision/hearing changes, anorexia, fever ,adenopathy, persistant/recurrent hoarseness, swallowing issues, chest pain, palpitations, edema, persistant/recurrent cough, hemoptysis, dyspnea (rest,exertional, paroxysmal nocturnal), gastrointestinal  bleeding (melena, rectal bleeding), abdominal pain, excessive heart burn, GU symptoms (dysuria, hematuria, voiding/incontinence issues) syncope, focal weakness, memory loss, numbness & tingling, skin/hair/nail changes, depression, anxiety, abnormal bruising/bleeding, musculoskeletal symptoms/signs.   Patient Active Problem List   Diagnosis Date Noted  . Biceps tendonosis of right shoulder 05/24/2019  . Superior labrum anterior-to-posterior (SLAP) tear of right shoulder 05/24/2019  . Supraspinatus tendon tear 05/24/2019  . Carpal tunnel syndrome, left upper limb 02/20/2019  . Impingement syndrome of right shoulder 02/20/2019  . Bilateral hand numbness 01/25/2019  . Right elbow tendinitis 06/12/2015  . Physical exam 12/18/2014  . Sty 12/18/2014  . Essential hypertension, benign 09/25/2012  . Low testosterone 09/25/2012  . Pure hypercholesterolemia 09/25/2012  . History of anemia 09/25/2012    Social History   Tobacco Use  .  Smoking status: Former Smoker    Packs/day: 1.00    Types: Cigarettes    Quit date: 09/13/2018    Years since quitting: 0.7  . Smokeless tobacco: Never Used  Substance Use Topics  . Alcohol use: Yes    Alcohol/week: 0.0 standard drinks    Current Outpatient Medications:  .  atorvastatin (LIPITOR) 20 MG tablet, TAKE 1 TABLET DAILY, Disp: 90 tablet, Rfl: 0 .  lisinopril-hydrochlorothiazide (ZESTORETIC) 20-12.5 MG tablet, TAKE 1 TABLET DAILY, Disp: 90 tablet, Rfl: 1 .  MULTIPLE VITAMIN PO, Take 1 tablet by mouth daily., Disp: , Rfl:   Allergies  Allergen Reactions  . Shellfish Allergy Itching    Palms of hands and bottoms of feet    Objective:   BP 132/72   Pulse 90   Temp 98.7 F (37.1 C) (Temporal)   Ht 6' (1.829 m)   Wt 195 lb (88.5 kg)   BMI 26.45 kg/m   AAOx3, NAD NCAT, EOMI No obvious CN deficits Coloring WNL Pt is able to speak clearly, coherently without shortness of breath or increased work of breathing.  Thought process is linear.  Mood is appropriate.   Assessment and Plan:   CPE- UTD on immunizations, has [facility].  Limited video PE WNL.  Check labs.  Anticipatory guidance provided.    Annye Asa, MD 06/29/2019

## 2019-06-30 ENCOUNTER — Ambulatory Visit (INDEPENDENT_AMBULATORY_CARE_PROVIDER_SITE_OTHER): Payer: Commercial Managed Care - PPO

## 2019-06-30 DIAGNOSIS — Z125 Encounter for screening for malignant neoplasm of prostate: Secondary | ICD-10-CM

## 2019-06-30 DIAGNOSIS — I10 Essential (primary) hypertension: Secondary | ICD-10-CM

## 2019-06-30 LAB — BASIC METABOLIC PANEL
BUN: 22 mg/dL (ref 6–23)
CO2: 26 mEq/L (ref 19–32)
Calcium: 9.5 mg/dL (ref 8.4–10.5)
Chloride: 102 mEq/L (ref 96–112)
Creatinine, Ser: 0.89 mg/dL (ref 0.40–1.50)
GFR: 90.19 mL/min (ref >60.00)
Glucose, Bld: 87 mg/dL (ref 70–99)
Potassium: 5 mEq/L (ref 3.5–5.1)
Sodium: 138 mEq/L (ref 135–145)

## 2019-06-30 LAB — LIPID PANEL
Cholesterol: 165 mg/dL (ref 0–200)
HDL: 38 mg/dL — ABNORMAL LOW (ref >39.00)
LDL Cholesterol: 103 mg/dL — ABNORMAL HIGH (ref 0–99)
NonHDL: 126.65
Total CHOL/HDL Ratio: 4
Triglycerides: 120 mg/dL (ref 0.0–149.0)
VLDL: 24 mg/dL (ref 0.0–40.0)

## 2019-06-30 LAB — PSA: PSA: 0.64 ng/mL (ref 0.10–4.00)

## 2019-06-30 LAB — CBC WITH DIFFERENTIAL/PLATELET
Basophils Absolute: 0.1 10*3/uL (ref 0.0–0.1)
Basophils Relative: 0.6 % (ref 0.0–3.0)
Eosinophils Absolute: 0.2 10*3/uL (ref 0.0–0.7)
Eosinophils Relative: 2.2 % (ref 0.0–5.0)
HCT: 43.1 % (ref 39.0–52.0)
Hemoglobin: 14.2 g/dL (ref 13.0–17.0)
Lymphocytes Relative: 35.5 % (ref 12.0–46.0)
Lymphs Abs: 3.2 10*3/uL (ref 0.7–4.0)
MCHC: 33 g/dL (ref 30.0–36.0)
MCV: 92.8 fl (ref 78.0–100.0)
Monocytes Absolute: 1 10*3/uL (ref 0.1–1.0)
Monocytes Relative: 10.6 % (ref 3.0–12.0)
Neutro Abs: 4.6 10*3/uL (ref 1.4–7.7)
Neutrophils Relative %: 51.1 % (ref 43.0–77.0)
Platelets: 309 10*3/uL (ref 150.0–400.0)
RBC: 4.65 Mil/uL (ref 4.22–5.81)
RDW: 14.5 % (ref 11.5–15.5)
WBC: 9 10*3/uL (ref 4.0–10.5)

## 2019-06-30 LAB — TSH: TSH: 1.48 u[IU]/mL (ref 0.35–4.50)

## 2019-06-30 LAB — HEPATIC FUNCTION PANEL
ALT: 33 U/L (ref 0–53)
AST: 18 U/L (ref 0–37)
Albumin: 4.7 g/dL (ref 3.5–5.2)
Alkaline Phosphatase: 85 U/L (ref 39–117)
Bilirubin, Direct: 0 mg/dL (ref 0.0–0.3)
Total Bilirubin: 0.4 mg/dL (ref 0.2–1.2)
Total Protein: 7.3 g/dL (ref 6.0–8.3)

## 2019-07-01 ENCOUNTER — Encounter: Payer: Self-pay | Admitting: Physical Therapy

## 2019-07-01 ENCOUNTER — Other Ambulatory Visit: Payer: Self-pay

## 2019-07-01 ENCOUNTER — Ambulatory Visit: Payer: Commercial Managed Care - PPO | Admitting: Physical Therapy

## 2019-07-01 DIAGNOSIS — M25611 Stiffness of right shoulder, not elsewhere classified: Secondary | ICD-10-CM

## 2019-07-01 DIAGNOSIS — R600 Localized edema: Secondary | ICD-10-CM

## 2019-07-01 DIAGNOSIS — M6281 Muscle weakness (generalized): Secondary | ICD-10-CM

## 2019-07-01 DIAGNOSIS — R293 Abnormal posture: Secondary | ICD-10-CM | POA: Diagnosis not present

## 2019-07-01 DIAGNOSIS — M25511 Pain in right shoulder: Secondary | ICD-10-CM

## 2019-07-01 NOTE — Patient Instructions (Signed)
Access Code: [facility]  URL: [email or web address]  Date: 07/01/2019  Prepared by: Faustino Congress   Exercises Supine Shoulder Flexion with Dowel - 10 reps - 1 sets - 3-5 sec hold - 3x daily - 7x weekly Supine Shoulder External Rotation with Dowel - 10 reps - 1 sets - 1-2 sec hold - 3x daily - 7x weekly Supine Shoulder Abduction AAROM with Dowel - 10 reps - 1 sets - 1-2 hold - 3x daily - 7x weekly Shoulder Flexion Wall Walk - 1 sets - 10-15 sec hold - 2 Reps Every Hour - 7x weekly Standing Scapular Retraction - 1 sets - 5 sec hold - 10 Reps Every Hour - 7x weekly

## 2019-07-01 NOTE — Therapy (Signed)
[facility] [address] [place], [place], [address] Phone: [phone]   Fax:  [phone]  Physical Therapy Evaluation  Patient Details  Name: Alejandro [place] MRN: [id number] Date of Birth: 1970-12-07 Referring Provider (PT): Marybelle Killings, MD   Encounter Date: 07/01/2019  PT End of Session - 07/01/19 1134    Visit Number  1    Number of Visits  30    Date for PT Re-Evaluation  09/23/19    PT Start Time  1100    PT Stop Time  1130    PT Time Calculation (min)  30 min    Activity Tolerance  Patient tolerated treatment well    Behavior During Therapy  Alejandro [place] for tasks assessed/performed       Past Medical History:  Diagnosis Date  . Eczema   . Hyperlipidemia   . Hypertension   . Tennis elbow    Had a cortisone injection in left elbow in the beginning of 2019    Past Surgical History:  Procedure Laterality Date  . GANGLION CYST EXCISION  11/22/08  . ROTATOR CUFF REPAIR    . WISDOM TOOTH EXTRACTION      There were no vitals filed for this visit.   Subjective Assessment - 07/01/19 1101    Subjective  Pt is a 48 y/o male who presents to OPPT s/p Rt RTC repair with labral repair and biceps tenodesis on 05/27/2019.  Pt presents today with decreased motion and strength following surgery.    Patient Stated Goals  regain full use of Rt shoulder    Currently in Pain?  Yes    Pain Score  0-No pain   up to 7-8/10   Pain Location  Shoulder    Pain Orientation  Right    Pain Descriptors / Indicators  Aching;Sore    Pain Type  Acute pain    Pain Onset  More than a month ago    Pain Frequency  Intermittent    Aggravating Factors   end range flexion with exercise    Pain Relieving Factors  subsides after         [facility] [place] PT Assessment - 07/01/19 1104      Assessment   Medical Diagnosis  S/P RCR and biceps tenodesis right shoulder    Referring Provider (PT)  Marybelle Killings, MD    Onset Date/Surgical Date  05/27/19    Hand Dominance  Left     Next MD Visit  07/18/2019    Prior Therapy  none      Precautions   Precautions  Shoulder    Precaution Comments  passive and AAROM shoulder; no biceps pulling x 3 wks      Restrictions   Weight Bearing Restrictions  No      Balance Screen   Has the patient fallen in the past 6 months  No    Has the patient had a decrease in activity level because of a fear of falling?   No    Is the patient reluctant to leave their home because of a fear of falling?   No      Home Environment   Living Environment  Private residence    Living Arrangements  Spouse/significant other    Additional Comments  some current modifications with ADLs      Prior Function   Level of Independence  Independent    Vocation  Full time employment   out on ST[employer]   Vocation Requirements  technician/mechanic - air crafts    Leisure  golfing, boating, camping, horseback riding; no regular exercise (walks 10,000 steps at work)      Charity fundraiser Status  Within Functional Limits for tasks assessed      Observation/Other Assessments   Observations  mild swelling present at this time      Posture/Postural Control   Posture/Postural Control  Postural limitations    Postural Limitations  Rounded Shoulders;Forward head      ROM / Strength   AROM / PROM / Strength  PROM;Strength;AROM      AROM   Overall AROM Comments  Lt shoulder WNL      PROM   Overall PROM Comments  measured supine    PROM Assessment Site  Shoulder    Right/Left Shoulder  Right    Right Shoulder Flexion  114 Degrees    Right Shoulder ABduction  73 Degrees    Right Shoulder Internal Rotation  68 Degrees   ~ 60 deg abductioin   Right Shoulder External Rotation  23 Degrees      Strength   Overall Strength Comments  deferred due to post op status      Palpation   Palpation comment  no significant areas of tenderness noted                Objective measurements completed on examination: See above findings.       [facility] Adult PT Treatment/Exercise - 07/01/19 1104      Exercises   Exercises  Shoulder      Shoulder Exercises: Supine   External Rotation  AAROM;Right;10 reps   cane   Flexion  Both;10 reps;AAROM   cane   ABduction  Left;AAROM;10 reps   cane; scaption     Shoulder Exercises: Seated   Retraction  Both;5 reps      Shoulder Exercises: Standing   Other Standing Exercises  verbally discussed wall walk that pt currently doing for flexion      Manual Therapy   Manual Therapy  Passive ROM    Passive ROM  Rt shoulder flexion, scaption, er, ir to tolerance             PT Education - 07/01/19 1133    Education Details  HEP    Person(s) Educated  Patient    Methods  Explanation;Demonstration;Handout    Comprehension  Verbalized understanding;Returned demonstration;Need further instruction       PT Short Term Goals - 07/01/19 1139      PT SHORT TERM GOAL #1   Title  independent with HEP    Status  New    Target Date  08/12/19      PT SHORT TERM GOAL #2   Title  improve Rt shoulder PROM by at least 15 degrees all planes for improved function    Status  New    Target Date  08/12/19      PT SHORT TERM GOAL #3   Title  report pain < 4/10 with activity for improved function    Status  New    Target Date  08/12/19        PT Long Term Goals - 07/01/19 1140      PT LONG TERM GOAL #1   Title  independent with advanced HEP    Status  New    Target Date  09/23/19      PT LONG TERM GOAL #2   Title  improve Rt shoulder AROM to [facility]  all planes for improved function and mobility    Status  New    Target Date  09/23/19      PT LONG TERM GOAL #3   Title  demonstrate at least 4/5 Rt shoulder strength for improved function    Status  New    Target Date  09/23/19      PT LONG TERM GOAL #4   Title  report pain < 2/10 with activity for improved function    Status  New    Target Date  09/23/19             Plan - 07/01/19 1125    Clinical Impression  Statement  Pt is a 48 y/o male who presents to OPPT s/p Rt RTC repair with biceps tenodesis on 05/27/2019.  Pt demonstrates postural abnormalities, decreased strength and ROM affecting functional mobility.  Pt will benefit from PT to address deficits listed.    Personal Factors and Comorbidities  Profession    Examination-Activity Limitations  Lift;Bathing;Reach Overhead;Carry;Dressing;Hygiene/Grooming    Examination-Participation Restrictions  Other;Cleaning;Yard Work   occupation, Adult nurse  Stable/Uncomplicated    Clinical Decision Making  Low    Rehab Potential  Good    PT Frequency  3x / week   3x/wk x 6 wks, then 2x/wk x 6 wks   PT Duration  12 weeks    PT Treatment/Interventions  ADLs/Self Care Home Management;Cryotherapy;Electrical Stimulation;Ultrasound;Moist Heat;Therapeutic activities;Therapeutic exercise;Patient/family education;Neuromuscular re-education;Manual techniques;Scar mobilization;Vasopneumatic Device;Taping;Dry needling;Passive range of motion    PT Next Visit Plan  review HEP, pulleys/wall ladder, manual for ROM, continue passive/AA range exercises modalities PRN    PT Home Exercise Plan  Access Code: [email or web address]    Consulted and Agree with Plan of Care  Patient       Patient will benefit from skilled therapeutic intervention in order to improve the following deficits and impairments:  Increased fascial restricitons, Pain, Postural dysfunction, Decreased range of motion, Impaired UE functional use, Decreased strength, Increased edema  Visit Diagnosis: Acute pain of right shoulder - Plan: PT plan of care cert/re-cert  Stiffness of right shoulder, not elsewhere classified - Plan: PT plan of care cert/re-cert  Abnormal posture - Plan: PT plan of care cert/re-cert  Muscle weakness (generalized) - Plan: PT plan of care cert/re-cert  Localized edema - Plan: PT plan of care cert/re-cert     Problem List Patient Active  Problem List   Diagnosis Date Noted  . Biceps tendonosis of right shoulder 05/24/2019  . Superior labrum anterior-to-posterior (SLAP) tear of right shoulder 05/24/2019  . Supraspinatus tendon tear 05/24/2019  . Carpal tunnel syndrome, left upper limb 02/20/2019  . Impingement syndrome of right shoulder 02/20/2019  . Bilateral hand numbness 01/25/2019  . Right elbow tendinitis 06/12/2015  . Physical exam 12/18/2014  . Sty 12/18/2014  . Essential hypertension, benign 09/25/2012  . Low testosterone 09/25/2012  . Pure hypercholesterolemia 09/25/2012  . History of anemia 09/25/2012        Laureen Abrahams, PT, DPT 07/01/19 11:43 AM        [facility] [address] [place], [place], [address] Phone: [phone]   Fax:  [phone]  Name: Alejandro Ayers MRN: [id number] Date of Birth: 07-21-1971

## 2019-07-12 ENCOUNTER — Ambulatory Visit: Payer: Commercial Managed Care - PPO | Attending: Internal Medicine

## 2019-07-12 DIAGNOSIS — Z20822 Contact with and (suspected) exposure to covid-19: Secondary | ICD-10-CM

## 2019-07-14 LAB — NOVEL CORONAVIRUS, NAA: SARS-CoV-2, NAA: NOT DETECTED

## 2019-07-19 ENCOUNTER — Ambulatory Visit: Payer: Commercial Managed Care - PPO | Admitting: Orthopaedic Surgery

## 2019-07-26 ENCOUNTER — Other Ambulatory Visit: Payer: Self-pay

## 2019-07-26 ENCOUNTER — Encounter: Payer: Self-pay | Admitting: Orthopaedic Surgery

## 2019-07-26 ENCOUNTER — Ambulatory Visit (INDEPENDENT_AMBULATORY_CARE_PROVIDER_SITE_OTHER): Payer: Commercial Managed Care - PPO | Admitting: Orthopaedic Surgery

## 2019-07-26 VITALS — Ht 72.0 in | Wt 195.0 lb

## 2019-07-26 DIAGNOSIS — M67813 Other specified disorders of tendon, right shoulder: Secondary | ICD-10-CM

## 2019-07-26 DIAGNOSIS — M75101 Unspecified rotator cuff tear or rupture of right shoulder, not specified as traumatic: Secondary | ICD-10-CM

## 2019-07-26 NOTE — Progress Notes (Signed)
   Post-Op Visit Note   Patient: Alejandro [place]           Date of Birth: [birth year]           MRN: [id number] Visit Date: 07/26/2019 PCP: Midge Minium, MD   Assessment & Plan: Post rotator cuff repair and biceps tenodesis.  He started work yesterday.  He is left-hand dominant will avoid right hand pulling with his biceps.  He is happy with the surgical result he can get his arm up over his head easily.  Chief Complaint:  Chief Complaint  Patient presents with  . Right Shoulder - Follow-up    05/27/2019 Right Shoulder Arthroscopy, Debridement, Open RCR, Biceps Tenodesis   Visit Diagnoses: Post rotator cuff repair right shoulder and biceps tenodesis right shoulder.  Plan: Continue gradual strengthening.  He is going skiing at the end of February.  He can return if he has increased problems.  Follow-Up Instructions: Return if symptoms worsen or fail to improve.   Orders:  No orders of the defined types were placed in this encounter.  No orders of the defined types were placed in this encounter.   Imaging: No results found.  PMFS History: Patient Active Problem List   Diagnosis Date Noted  . Biceps tendonosis of right shoulder 05/24/2019  . Superior labrum anterior-to-posterior (SLAP) tear of right shoulder 05/24/2019  . Supraspinatus tendon tear 05/24/2019  . Carpal tunnel syndrome, left upper limb 02/20/2019  . Impingement syndrome of right shoulder 02/20/2019  . Bilateral hand numbness 01/25/2019  . Right elbow tendinitis 06/12/2015  . Physical exam 12/18/2014  . Sty 12/18/2014  . Essential hypertension, benign 09/25/2012  . Low testosterone 09/25/2012  . Pure hypercholesterolemia 09/25/2012  . History of anemia 09/25/2012   Past Medical History:  Diagnosis Date  . Eczema   . Hyperlipidemia   . Hypertension   . Tennis elbow    Had a cortisone injection in left elbow in the beginning of 2019    Family History  Problem Relation Age of Onset  .  Hypertension Father   . CVA Sister        after head injury  . Prostate cancer Neg Hx   . Colon cancer Neg Hx     Past Surgical History:  Procedure Laterality Date  . GANGLION CYST EXCISION  11/22/08  . ROTATOR CUFF REPAIR    . WISDOM TOOTH EXTRACTION     Social History   Occupational History  . Not on file  Tobacco Use  . Smoking status: Former Smoker    Packs/day: 1.00    Types: Cigarettes    Quit date: 09/13/2018    Years since quitting: 0.8  . Smokeless tobacco: Never Used  Substance and Sexual Activity  . Alcohol use: Yes    Alcohol/week: 0.0 standard drinks  . Drug use: No  . Sexual activity: Yes

## 2019-08-13 ENCOUNTER — Other Ambulatory Visit: Payer: Self-pay | Admitting: Family Medicine

## 2019-08-29 ENCOUNTER — Other Ambulatory Visit: Payer: Self-pay | Admitting: Family Medicine

## 2019-10-30 ENCOUNTER — Other Ambulatory Visit: Payer: Self-pay | Admitting: Family Medicine

## 2019-11-15 ENCOUNTER — Encounter: Payer: Self-pay | Admitting: Family Medicine

## 2019-11-16 MED ORDER — SCOPOLAMINE 1 MG/3DAYS TD PT72: 1.0000 | MEDICATED_PATCH | TRANSDERMAL | 1 refills | Status: DC

## 2019-12-06 ENCOUNTER — Encounter: Payer: Self-pay | Admitting: Orthopaedic Surgery

## 2019-12-06 ENCOUNTER — Other Ambulatory Visit: Payer: Self-pay

## 2019-12-06 ENCOUNTER — Ambulatory Visit: Payer: Self-pay

## 2019-12-06 ENCOUNTER — Ambulatory Visit: Payer: Commercial Managed Care - PPO | Admitting: Orthopaedic Surgery

## 2019-12-06 VITALS — BP 117/74 | HR 87 | Ht 72.0 in | Wt 195.0 lb

## 2019-12-06 DIAGNOSIS — M25522 Pain in left elbow: Secondary | ICD-10-CM | POA: Diagnosis not present

## 2019-12-06 NOTE — Progress Notes (Signed)
Office Visit Note   Patient: Alejandro [place]           Date of Birth: 1970-10-18           MRN: [id number] Visit Date: 12/06/2019              Requested by: Midge Minium, MD [address] [place],  [place] [address] PCP: Midge Minium, MD   Assessment & Plan: Visit Diagnoses:  1. Pain in left elbow     Plan: Patient has left elbow medial epicondylitis.  We discussed pathophysiology discussed things he can do at work to decrease stress on the tendon until out to improve.  He can use some anti-inflammatories intermittent ice but primarily avoid repetitive gripping and squeezing with the left hand.  If he is not getting better with activity modification he can return for injection.  Follow-Up Instructions: Return if symptoms worsen or fail to improve.   Orders:  Orders Placed This Encounter  Procedures  . XR Elbow 2 Views Left   No orders of the defined types were placed in this encounter.     Procedures: No procedures performed   Clinical Data: No additional findings.   Subjective: Chief Complaint  Patient presents with  . Left Elbow - Pain    HPI 49 year old male returns he works at [employer].  We had previous left carpal tunnel release and previous left lateral epicondylar injection for tennis elbow which did well.  He is now having pain medially has pain with squeezing gripping.  Pain is been present for 2 to 3weeks.  He denies numbness or tingling in his fingers no loss of grip strength just pain medially at the elbow with gripping.  Denies neck pain.  Review of Systems 14 system update noncontributory.  Objective: Vital Signs: BP 117/74   Pulse 87   Ht 6' (1.829 m)   Wt 195 lb (88.5 kg)   BMI 26.45 kg/m   Physical Exam Constitutional:      Appearance: He is well-developed.  HENT:     Head: Normocephalic and atraumatic.  Eyes:     Pupils: Pupils are equal, round, and reactive to light.  Neck:     Thyroid: No  thyromegaly.     Trachea: No tracheal deviation.  Cardiovascular:     Rate and Rhythm: Normal rate.  Pulmonary:     Effort: Pulmonary effort is normal.     Breath sounds: No wheezing.  Abdominal:     General: Bowel sounds are normal.     Palpations: Abdomen is soft.  Skin:    General: Skin is warm and dry.     Capillary Refill: Capillary refill takes less than 2 seconds.  Neurological:     Mental Status: He is alert and oriented to person, place, and time.  Psychiatric:        Behavior: Behavior normal.        Thought Content: Thought content normal.        Judgment: Judgment normal.     Ortho Exam patient has good cervical range of motion negative Spurling no brachial plexus tenderness.  No impingement of the shoulder long head of the biceps is normal he can his arm up over his head.  Elbow has full range of motion.  No lateral epicondylar tenderness but is tender over the medial epicondyle flexor tendon origin.  Ulnar nerve is normal stable in the groove interossei first dorsal interosseous and  abductor are normal good grip strength but pain at the medial epicondyle.  Specialty Comments:  No specialty comments available.  Imaging: XR Elbow 2 Views Left  Result Date: 12/06/2019 AP lateral left elbow x-rays are obtained and reviewed.  These are negative for acute changes negative for degenerative changes.  No soft tissue calcification. Impression: Normal left elbow radiographs.    PMFS History: Patient Active Problem List   Diagnosis Date Noted  . Biceps tendonosis of right shoulder 05/24/2019  . Superior labrum anterior-to-posterior (SLAP) tear of right shoulder 05/24/2019  . Supraspinatus tendon tear 05/24/2019  . Carpal tunnel syndrome, left upper limb 02/20/2019  . Impingement syndrome of right shoulder 02/20/2019  . Bilateral hand numbness 01/25/2019  . Right elbow tendinitis 06/12/2015  . Physical exam 12/18/2014  . Sty 12/18/2014  . Essential hypertension, benign  09/25/2012  . Low testosterone 09/25/2012  . Pure hypercholesterolemia 09/25/2012  . History of anemia 09/25/2012   Past Medical History:  Diagnosis Date  . Eczema   . Hyperlipidemia   . Hypertension   . Tennis elbow    Had a cortisone injection in left elbow in the beginning of 2019    Family History  Problem Relation Age of Onset  . Hypertension Father   . CVA Sister        after head injury  . Prostate cancer Neg Hx   . Colon cancer Neg Hx     Past Surgical History:  Procedure Laterality Date  . GANGLION CYST EXCISION  11/22/08  . ROTATOR CUFF REPAIR    . WISDOM TOOTH EXTRACTION     Social History   Occupational History  . Not on file  Tobacco Use  . Smoking status: Former Smoker    Packs/day: 1.00    Types: Cigarettes    Quit date: 09/13/2018    Years since quitting: 1.2  . Smokeless tobacco: Never Used  Substance and Sexual Activity  . Alcohol use: Yes    Alcohol/week: 0.0 standard drinks  . Drug use: No  . Sexual activity: Yes

## 2019-12-09 ENCOUNTER — Other Ambulatory Visit: Payer: Self-pay | Admitting: Family Medicine

## 2020-01-04 ENCOUNTER — Ambulatory Visit: Payer: Commercial Managed Care - PPO | Admitting: Family Medicine

## 2020-01-12 ENCOUNTER — Other Ambulatory Visit: Payer: Self-pay

## 2020-01-12 ENCOUNTER — Encounter: Payer: Self-pay | Admitting: Family Medicine

## 2020-01-12 ENCOUNTER — Ambulatory Visit: Payer: Commercial Managed Care - PPO | Admitting: Family Medicine

## 2020-01-12 VITALS — BP 124/70 | HR 76 | Temp 97.8°F | Resp 16 | Ht 72.0 in | Wt 193.5 lb

## 2020-01-12 DIAGNOSIS — I10 Essential (primary) hypertension: Secondary | ICD-10-CM | POA: Diagnosis not present

## 2020-01-12 DIAGNOSIS — E663 Overweight: Secondary | ICD-10-CM | POA: Insufficient documentation

## 2020-01-12 DIAGNOSIS — Z720 Tobacco use: Secondary | ICD-10-CM

## 2020-01-12 DIAGNOSIS — E7800 Pure hypercholesterolemia, unspecified: Secondary | ICD-10-CM | POA: Diagnosis not present

## 2020-01-12 NOTE — Assessment & Plan Note (Signed)
Chronic problem.  Well controlled.  Asymptomatic.  Check labs.  No anticipated med changes.  Will follow. 

## 2020-01-12 NOTE — Assessment & Plan Note (Signed)
Ongoing issue for pt.  Encouraged healthy diet and regular exercise.  Will follow.

## 2020-01-12 NOTE — Assessment & Plan Note (Signed)
Restarted smoking during COVID.  Again encouraged him to quit.  Will follow.

## 2020-01-12 NOTE — Patient Instructions (Addendum)
Schedule your complete physical in 6 months We'll notify you of your lab results and make any changes if needed You quit once so you can do it again!!! Call with any questions or concerns Hang in there!!

## 2020-01-12 NOTE — Assessment & Plan Note (Signed)
Chronic problem.  Tolerating statin w/o difficulty.  Check labs.  Adjust meds prn  

## 2020-01-12 NOTE — Progress Notes (Signed)
   Subjective:    Patient ID: Alejandro [place], male    DOB: [birth year], 49 y.o.   MRN: [id number]  HPI HTN- chronic problem, on Lisinopril HCTZ 20/12.5mg  daily w/ good control.  Denies CP, SOB, HAs, visual changes, edema.  Hyperlipidemia- chronic problem, on Lipitor 20mg  daily.  Denies abd pain, N/V.  Tobacco abuse- pt restarted smoking during COVID.  Smoking less than 1ppd.  'I know I want to quit, it's just when'.    Overweight- pt's BMI is 26.24  Active job but no regular exercise.   Review of Systems For ROS see HPI   This visit occurred during the SARS-CoV-2 public health emergency.  Safety protocols were in place, including screening questions prior to the visit, additional usage of staff PPE, and extensive cleaning of exam room while observing appropriate contact time as indicated for disinfecting solutions.       Objective:   Physical Exam Vitals reviewed.  Constitutional:      General: He is not in acute distress.    Appearance: Normal appearance. He is well-developed.  HENT:     Head: Normocephalic and atraumatic.  Eyes:     Conjunctiva/sclera: Conjunctivae normal.     Pupils: Pupils are equal, round, and reactive to light.  Neck:     Thyroid: No thyromegaly.  Cardiovascular:     Rate and Rhythm: Normal rate and regular rhythm.     Heart sounds: Normal heart sounds. No murmur heard.   Pulmonary:     Effort: Pulmonary effort is normal. No respiratory distress.     Breath sounds: Normal breath sounds.  Abdominal:     General: Bowel sounds are normal. There is no distension.     Palpations: Abdomen is soft.  Musculoskeletal:     Cervical back: Normal range of motion and neck supple.  Lymphadenopathy:     Cervical: No cervical adenopathy.  Skin:    General: Skin is warm and dry.  Neurological:     Mental Status: He is alert and oriented to person, place, and time.     Cranial Nerves: No cranial nerve deficit.  Psychiatric:        Behavior: Behavior normal.            Assessment & Plan:

## 2020-01-13 ENCOUNTER — Encounter: Payer: Self-pay | Admitting: General Practice

## 2020-01-13 LAB — CBC WITH DIFFERENTIAL/PLATELET
Basophils Absolute: 0.1 10*3/uL (ref 0.0–0.1)
Basophils Relative: 1.3 % (ref 0.0–3.0)
Eosinophils Absolute: 0.2 10*3/uL (ref 0.0–0.7)
Eosinophils Relative: 2.5 % (ref 0.0–5.0)
HCT: 39.4 % (ref 39.0–52.0)
Hemoglobin: 13.6 g/dL (ref 13.0–17.0)
Lymphocytes Relative: 27.2 % (ref 12.0–46.0)
Lymphs Abs: 2.4 10*3/uL (ref 0.7–4.0)
MCHC: 34.5 g/dL (ref 30.0–36.0)
MCV: 91.8 fl (ref 78.0–100.0)
Monocytes Absolute: 1 10*3/uL (ref 0.1–1.0)
Monocytes Relative: 11.9 % (ref 3.0–12.0)
Neutro Abs: 5 10*3/uL (ref 1.4–7.7)
Neutrophils Relative %: 57.1 % (ref 43.0–77.0)
Platelets: 334 10*3/uL (ref 150.0–400.0)
RBC: 4.29 Mil/uL (ref 4.22–5.81)
RDW: 14.5 % (ref 11.5–15.5)
WBC: 8.8 10*3/uL (ref 4.0–10.5)

## 2020-01-13 LAB — BASIC METABOLIC PANEL
BUN: 26 mg/dL — ABNORMAL HIGH (ref 6–23)
CO2: 24 mEq/L (ref 19–32)
Calcium: 9.5 mg/dL (ref 8.4–10.5)
Chloride: 102 mEq/L (ref 96–112)
Creatinine, Ser: 0.98 mg/dL (ref 0.40–1.50)
GFR: 80.53 mL/min (ref >60.00)
Glucose, Bld: 88 mg/dL (ref 70–99)
Potassium: 4.5 mEq/L (ref 3.5–5.1)
Sodium: 139 mEq/L (ref 135–145)

## 2020-01-13 LAB — LIPID PANEL
Cholesterol: 156 mg/dL (ref 0–200)
HDL: 34.2 mg/dL — ABNORMAL LOW (ref >39.00)
LDL Cholesterol: 97 mg/dL (ref 0–99)
NonHDL: 122.29
Total CHOL/HDL Ratio: 5
Triglycerides: 124 mg/dL (ref 0.0–149.0)
VLDL: 24.8 mg/dL (ref 0.0–40.0)

## 2020-01-13 LAB — HEPATIC FUNCTION PANEL
ALT: 14 U/L (ref 0–53)
AST: 15 U/L (ref 0–37)
Albumin: 4.7 g/dL (ref 3.5–5.2)
Alkaline Phosphatase: 84 U/L (ref 39–117)
Bilirubin, Direct: 0.1 mg/dL (ref 0.0–0.3)
Total Bilirubin: 0.3 mg/dL (ref 0.2–1.2)
Total Protein: 7 g/dL (ref 6.0–8.3)

## 2020-01-13 LAB — TSH: TSH: 1.24 u[IU]/mL (ref 0.35–4.50)

## 2020-02-17 ENCOUNTER — Other Ambulatory Visit: Payer: Self-pay | Admitting: Family Medicine

## 2020-02-19 IMAGING — MR MR SHOULDER*R* W/O CM
4 of 5 series · 19 of 40 positions shown · non-contrast
Comparison: None.

CLINICAL DATA: Injury playing golf July 2018 and again January 2019
causing onset of right shoulder joint pain worse with abduction,
right arm weakness.

EXAM:
MRI OF THE RIGHT SHOULDER WITHOUT CONTRAST
TECHNIQUE: Multiplanar, multisequence MR imaging of the shoulder was performed.
No intravenous contrast was administered.

[Series 6: PD fat-sat · axial · right · 4.0mm · 0.44mm/px · z∈[-37,+54]mm · 6 of 20 slices shown (1 of 2)]
[im 1/20]
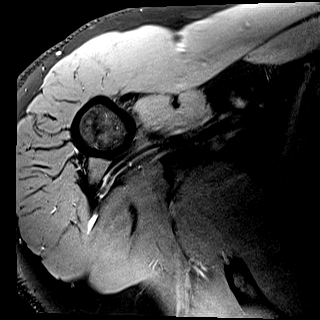
[im 4/20]
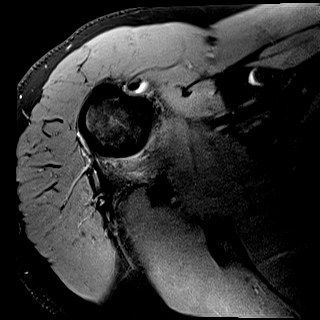
[im 8/20]
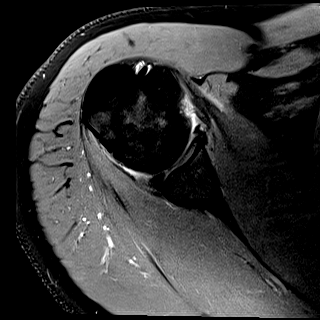
[im 12/20]
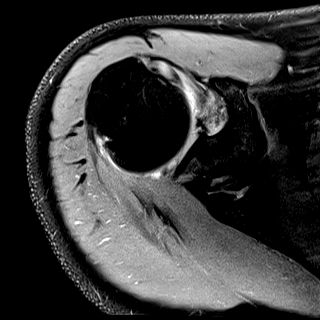
[im 16/20]
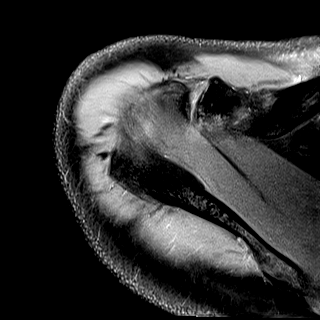
[im 20/20]
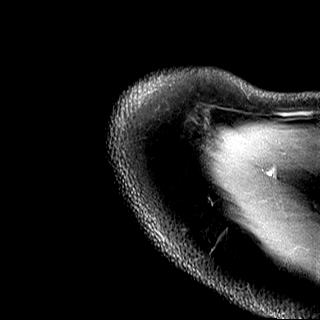

[Series 7: T2 fat-sat · oblique · right · 4.0mm · 0.22mm/px · 3 of 21 slices shown (1 of 2)]
[im 3/21]
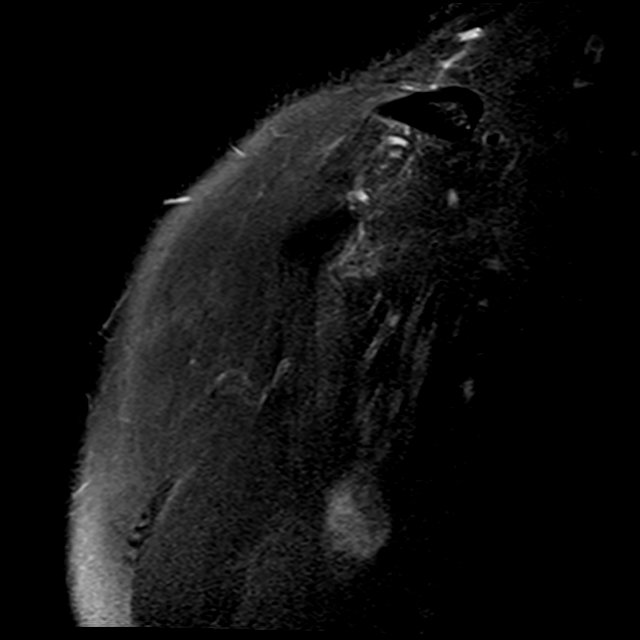
[im 12/21]
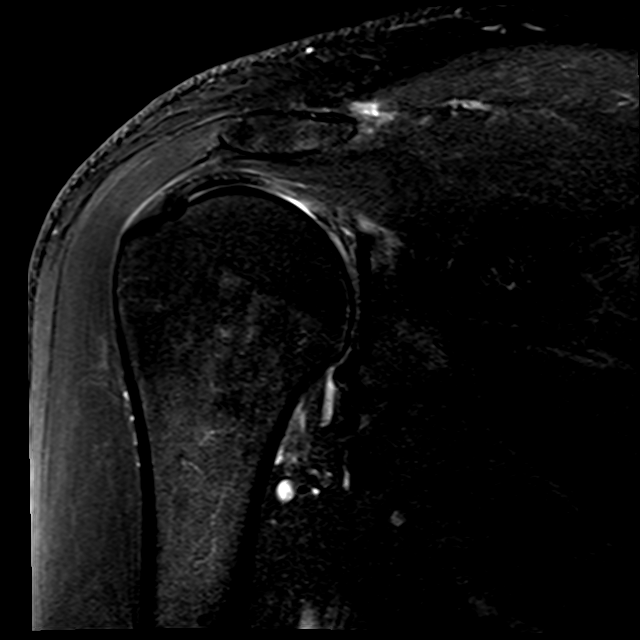
[im 18/21]
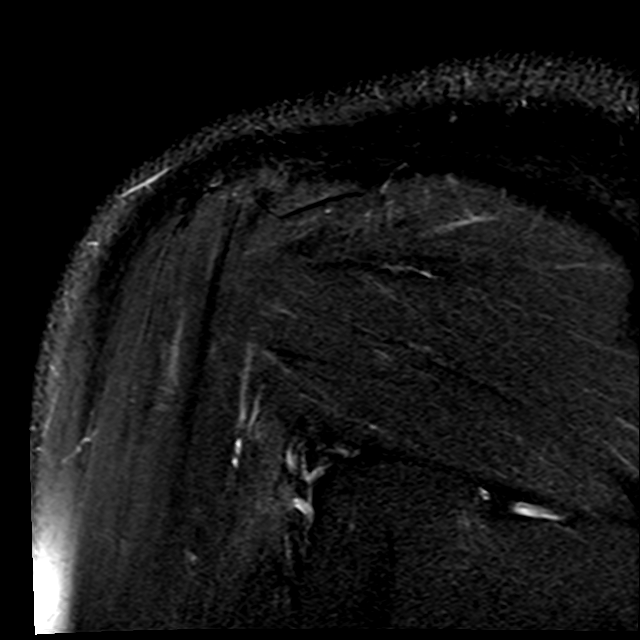

[Series 8: PD fat-sat · oblique · right · 4.0mm · 0.22mm/px · 7 of 21 slices shown (2 of 2)]
[im 1/21]
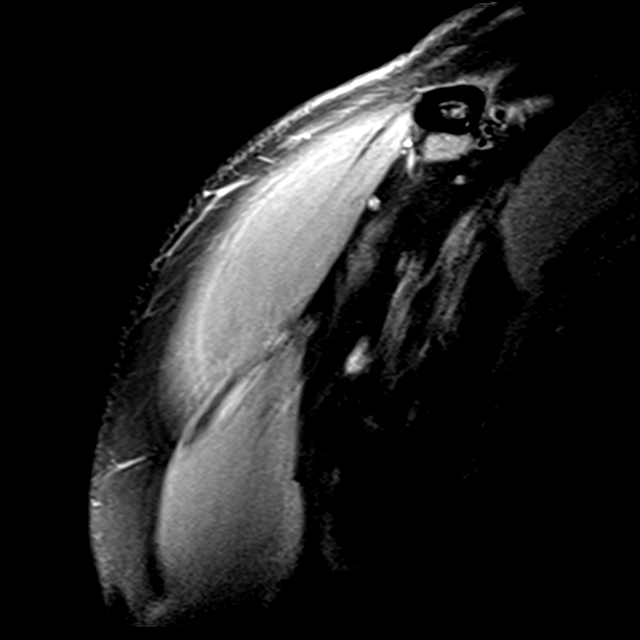
[im 3/21]
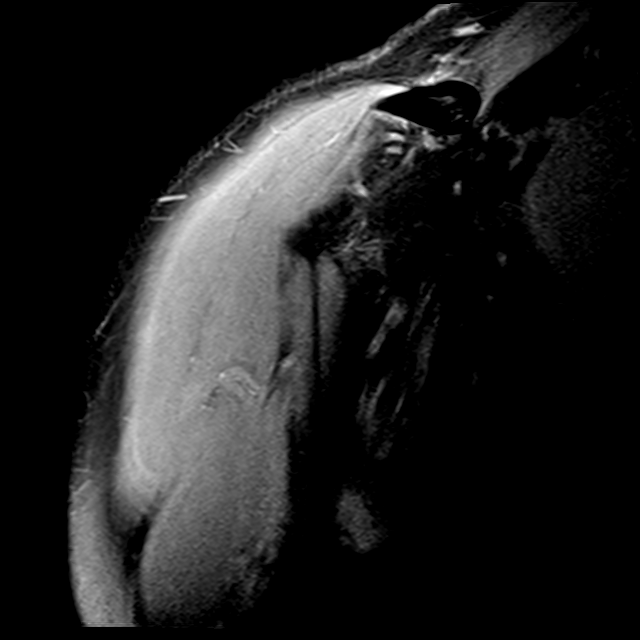
[im 6/21]
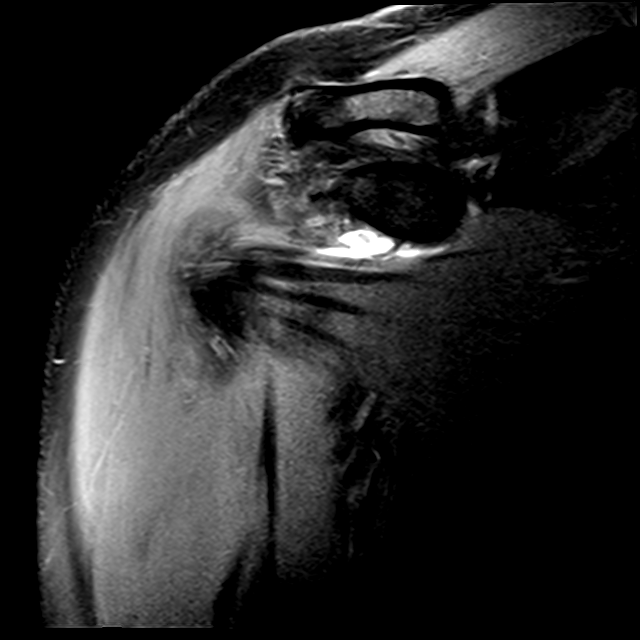
[im 9/21]
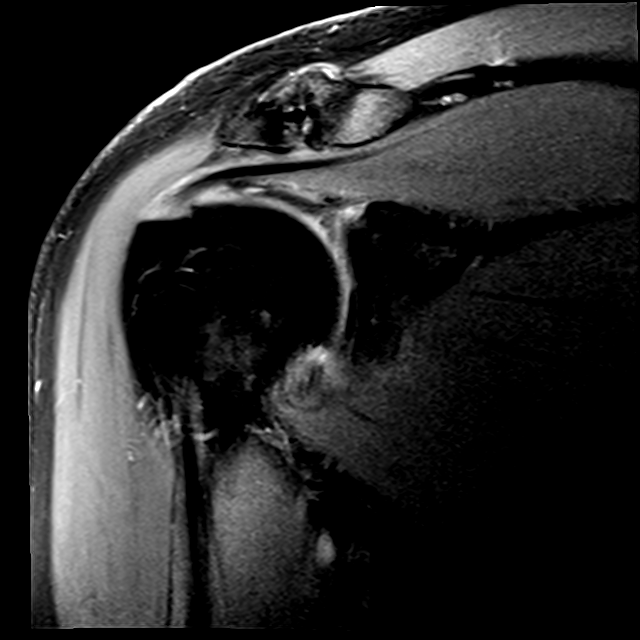
[im 12/21]
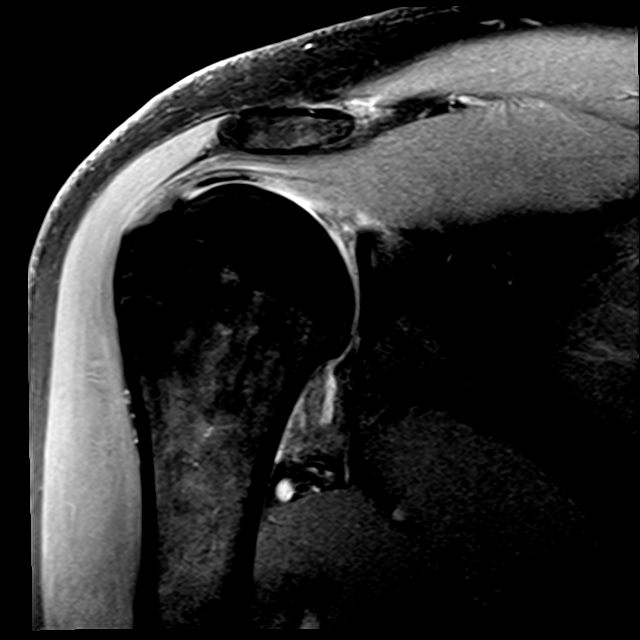
[im 15/21]
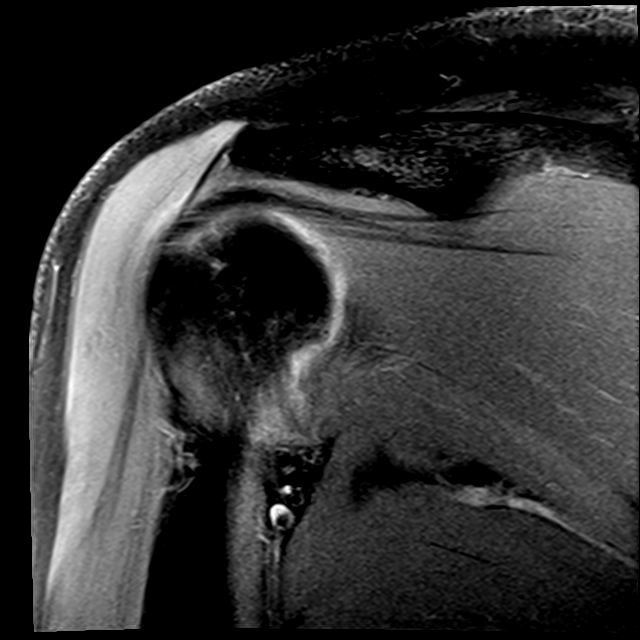
[im 18/21]
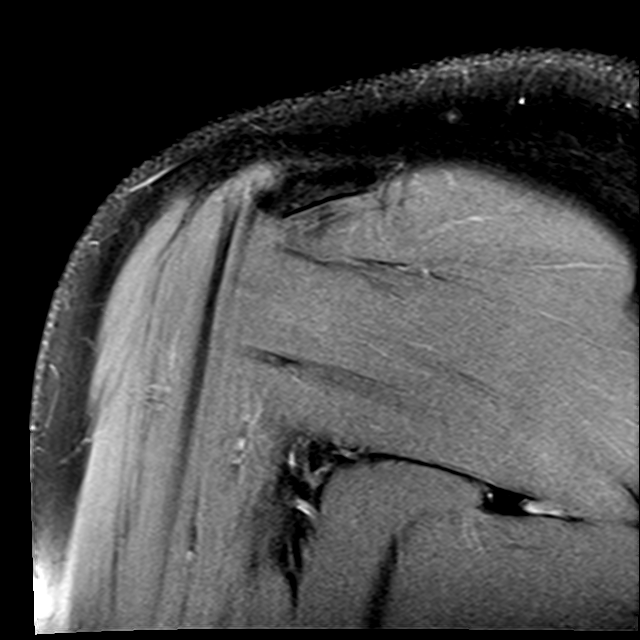

[Series 9: T2 fat-sat · oblique · right · 4.0mm · 0.44mm/px · 3 of 23 slices shown (2 of 2)]
[im 3/23]
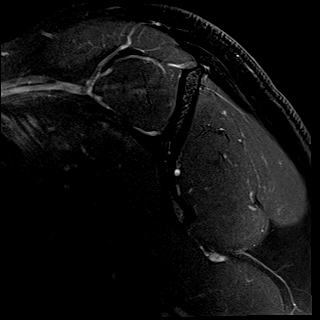
[im 12/23]
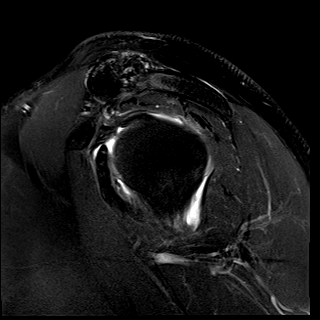
[im 20/23]
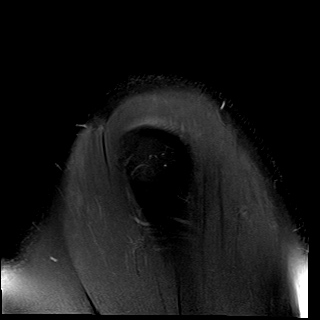

[19 of 40 positions shown; findings below may reference images not displayed]

FINDINGS: Rotator cuff: Severe tendinosis of the supraspinatus tendon.
Infraspinatus tendon is intact. Teres minor tendon is intact.
Subscapularis tendon is intact.

Muscles: No atrophy or abnormal signal of the muscles of the rotator
cuff.

Biceps long head: Severe tendinosis of the intraarticular portion of
the long head of the biceps tendon.

Acromioclavicular Joint: Moderate arthropathy of the
acromioclavicular joint. Type II acromion. Trace
subacromial/subdeltoid bursal fluid.

Glenohumeral Joint: No joint effusion. No chondral defect.

Labrum: Superior labral tear from anterior to posterior with
extension into the anterior labrum.

Bones:  No acute osseous abnormality.  No aggressive osseous lesion.

Other: No fluid collection or hematoma.
IMPRESSION: 1. Severe tendinosis of the supraspinatus tendon.
2. Severe tendinosis of the intraarticular portion of the long head
of the biceps tendon.
3. Superior labral tear from anterior to posterior with extension
into the anterior labrum.

## 2020-03-05 ENCOUNTER — Encounter: Payer: Self-pay | Admitting: Family Medicine

## 2020-03-05 ENCOUNTER — Other Ambulatory Visit: Payer: Self-pay | Admitting: General Practice

## 2020-03-05 MED ORDER — ATORVASTATIN CALCIUM 20 MG PO TABS: 20.0000 mg | ORAL_TABLET | Freq: Every day | ORAL | 1 refills | Status: DC

## 2020-07-02 ENCOUNTER — Encounter: Payer: Commercial Managed Care - PPO | Admitting: Family Medicine

## 2020-07-23 ENCOUNTER — Ambulatory Visit (INDEPENDENT_AMBULATORY_CARE_PROVIDER_SITE_OTHER): Payer: Commercial Managed Care - PPO | Admitting: Family Medicine

## 2020-07-23 ENCOUNTER — Encounter: Payer: Self-pay | Admitting: Family Medicine

## 2020-07-23 ENCOUNTER — Other Ambulatory Visit: Payer: Self-pay

## 2020-07-23 VITALS — BP 120/70 | HR 78 | Temp 99.2°F | Resp 17 | Ht 72.0 in | Wt 194.8 lb

## 2020-07-23 DIAGNOSIS — I10 Essential (primary) hypertension: Secondary | ICD-10-CM

## 2020-07-23 DIAGNOSIS — Z125 Encounter for screening for malignant neoplasm of prostate: Secondary | ICD-10-CM | POA: Diagnosis not present

## 2020-07-23 DIAGNOSIS — Z114 Encounter for screening for human immunodeficiency virus [HIV]: Secondary | ICD-10-CM

## 2020-07-23 DIAGNOSIS — Z0000 Encounter for general adult medical examination without abnormal findings: Secondary | ICD-10-CM

## 2020-07-23 NOTE — Progress Notes (Signed)
   Subjective:    Patient ID: Alejandro [place], male    DOB: 08/10/1970, 50 y.o.   MRN: [id number]  HPI CPE- [place] ([facility]), COVID, Tdap, flu.  Reviewed past medical, surgical, family and social histories.   Health Maintenance  Topic Date Due  . HIV Screening  Never done  . COLONOSCOPY (Pts 45-21yrs Insurance coverage will need to be confirmed)  05/17/2019  . INFLUENZA VACCINE  02/19/2020  . COVID-19 Vaccine (3 - Booster for Moderna series) 08/08/2020 (Originally 04/26/2020)  . Hepatitis C Screening  01/11/2021 (Originally 1970/08/03)  . TETANUS/TDAP  12/30/2025      Review of Systems Patient reports no vision/hearing changes, anorexia, fever ,adenopathy, persistant/recurrent hoarseness, swallowing issues, chest pain, palpitations, edema, persistant/recurrent cough, hemoptysis, dyspnea (rest,exertional, paroxysmal nocturnal), gastrointestinal  bleeding (melena, rectal bleeding), abdominal pain, excessive heart burn, GU symptoms (dysuria, hematuria, voiding/incontinence issues) syncope, focal weakness, memory loss, numbness & tingling, skin/hair/nail changes, depression, anxiety, abnormal bruising/bleeding, musculoskeletal symptoms/signs.   This visit occurred during the SARS-CoV-2 public health emergency.  Safety protocols were in place, including screening questions prior to the visit, additional usage of staff PPE, and extensive cleaning of exam room while observing appropriate contact time as indicated for disinfecting solutions.       Objective:   Physical Exam General Appearance:    Alert, cooperative, no distress, appears stated age  Head:    Normocephalic, without obvious abnormality, atraumatic  Eyes:    PERRL, conjunctiva/corneas clear, EOM's intact, fundi    benign, both eyes       Ears:    Normal TM's and external ear canals, both ears  Nose:   Deferred due to COVID  Throat:   Neck:   Supple, symmetrical, trachea midline, no adenopathy;       thyroid:  No  enlargement/tenderness/nodules  Back:     Symmetric, no curvature, ROM normal, no CVA tenderness  Lungs:     Clear to auscultation bilaterally, respirations unlabored  Chest wall:    No tenderness or deformity  Heart:    Regular rate and rhythm, S1 and S2 normal, no murmur, rub   or gallop  Abdomen:     Soft, non-tender, bowel sounds active all four quadrants,    no masses, no organomegaly  Genitalia:    Deferred  Rectal:    Extremities:   Extremities normal, atraumatic, no cyanosis or edema  Pulses:   2+ and symmetric all extremities  Skin:   Skin color, texture, turgor normal, no rashes or lesions  Lymph nodes:   Cervical, supraclavicular, and axillary nodes normal  Neurologic:   CNII-XII intact. Normal strength, sensation and reflexes      throughout          Assessment & Plan:

## 2020-07-23 NOTE — Patient Instructions (Signed)
Follow up in 6 months to recheck BP and cholesterol We'll notify you of your lab results and make any changes if needed Continue to work on healthy diet and regular exercise- you can do it!! Call with any questions or concerns Stay Safe!  Stay Healthy! Happy Holidays!!! 

## 2020-07-23 NOTE — Assessment & Plan Note (Signed)
Pt's PE WNL.  UTD on colonoscopy, immunizations.  Check labs.  Anticipatory guidance provided.  

## 2020-07-23 NOTE — Assessment & Plan Note (Signed)
Chronic problem.  Well controlled today.  Asymptomatic.  Check labs.  No anticipated med changes.  Will follow. 

## 2020-07-24 LAB — TSH: TSH: 1.34 u[IU]/mL (ref 0.35–4.50)

## 2020-07-24 LAB — BASIC METABOLIC PANEL
BUN: 21 mg/dL (ref 6–23)
CO2: 29 mEq/L (ref 19–32)
Calcium: 9.7 mg/dL (ref 8.4–10.5)
Chloride: 102 mEq/L (ref 96–112)
Creatinine, Ser: 0.84 mg/dL (ref 0.40–1.50)
GFR: 100.72 mL/min (ref >60.00)
Glucose, Bld: 81 mg/dL (ref 70–99)
Potassium: 4.3 mEq/L (ref 3.5–5.1)
Sodium: 138 mEq/L (ref 135–145)

## 2020-07-24 LAB — PSA: PSA: 0.64 ng/mL (ref 0.10–4.00)

## 2020-07-24 LAB — LIPID PANEL
Cholesterol: 161 mg/dL (ref 0–200)
HDL: 37.4 mg/dL — ABNORMAL LOW (ref >39.00)
LDL Cholesterol: 105 mg/dL — ABNORMAL HIGH (ref 0–99)
NonHDL: 124.06
Total CHOL/HDL Ratio: 4
Triglycerides: 93 mg/dL (ref 0.0–149.0)
VLDL: 18.6 mg/dL (ref 0.0–40.0)

## 2020-07-24 LAB — CBC WITH DIFFERENTIAL/PLATELET
Basophils Absolute: 0.1 10*3/uL (ref 0.0–0.1)
Basophils Relative: 0.7 % (ref 0.0–3.0)
Eosinophils Absolute: 0.2 10*3/uL (ref 0.0–0.7)
Eosinophils Relative: 1.6 % (ref 0.0–5.0)
HCT: 41.7 % (ref 39.0–52.0)
Hemoglobin: 14.2 g/dL (ref 13.0–17.0)
Lymphocytes Relative: 22.7 % (ref 12.0–46.0)
Lymphs Abs: 2.3 10*3/uL (ref 0.7–4.0)
MCHC: 34.1 g/dL (ref 30.0–36.0)
MCV: 91.8 fl (ref 78.0–100.0)
Monocytes Absolute: 0.9 10*3/uL (ref 0.1–1.0)
Monocytes Relative: 9.3 % (ref 3.0–12.0)
Neutro Abs: 6.6 10*3/uL (ref 1.4–7.7)
Neutrophils Relative %: 65.7 % (ref 43.0–77.0)
Platelets: 309 10*3/uL (ref 150.0–400.0)
RBC: 4.54 Mil/uL (ref 4.22–5.81)
RDW: 14.3 % (ref 11.5–15.5)
WBC: 10 10*3/uL (ref 4.0–10.5)

## 2020-07-24 LAB — HEPATIC FUNCTION PANEL
ALT: 19 U/L (ref 0–53)
AST: 17 U/L (ref 0–37)
Albumin: 4.8 g/dL (ref 3.5–5.2)
Alkaline Phosphatase: 82 U/L (ref 39–117)
Bilirubin, Direct: 0.1 mg/dL (ref 0.0–0.3)
Total Bilirubin: 0.4 mg/dL (ref 0.2–1.2)
Total Protein: 7.1 g/dL (ref 6.0–8.3)

## 2020-07-24 LAB — HIV ANTIBODY (ROUTINE TESTING W REFLEX): HIV 1&2 Ab, 4th Generation: NONREACTIVE

## 2020-08-15 ENCOUNTER — Other Ambulatory Visit: Payer: Self-pay | Admitting: Family Medicine

## 2020-08-16 ENCOUNTER — Other Ambulatory Visit: Payer: Self-pay | Admitting: Family Medicine

## 2021-01-16 ENCOUNTER — Encounter: Payer: Self-pay | Admitting: *Deleted

## 2021-01-30 ENCOUNTER — Other Ambulatory Visit: Payer: Self-pay

## 2021-01-30 ENCOUNTER — Ambulatory Visit: Payer: Commercial Managed Care - PPO | Admitting: Family Medicine

## 2021-01-30 ENCOUNTER — Encounter: Payer: Self-pay | Admitting: Family Medicine

## 2021-01-30 VITALS — BP 115/80 | HR 83 | Temp 99.0°F | Resp 19 | Ht 72.0 in | Wt 191.0 lb

## 2021-01-30 DIAGNOSIS — Z23 Encounter for immunization: Secondary | ICD-10-CM | POA: Diagnosis not present

## 2021-01-30 DIAGNOSIS — I10 Essential (primary) hypertension: Secondary | ICD-10-CM

## 2021-01-30 DIAGNOSIS — K589 Irritable bowel syndrome without diarrhea: Secondary | ICD-10-CM | POA: Insufficient documentation

## 2021-01-30 DIAGNOSIS — E785 Hyperlipidemia, unspecified: Secondary | ICD-10-CM

## 2021-01-30 DIAGNOSIS — E663 Overweight: Secondary | ICD-10-CM

## 2021-01-30 LAB — HEPATIC FUNCTION PANEL
ALT: 19 U/L (ref 0–53)
AST: 18 U/L (ref 0–37)
Albumin: 4.7 g/dL (ref 3.5–5.2)
Alkaline Phosphatase: 96 U/L (ref 39–117)
Bilirubin, Direct: 0.1 mg/dL (ref 0.0–0.3)
Total Bilirubin: 0.4 mg/dL (ref 0.2–1.2)
Total Protein: 7.1 g/dL (ref 6.0–8.3)

## 2021-01-30 LAB — BASIC METABOLIC PANEL
BUN: 19 mg/dL (ref 6–23)
CO2: 26 mEq/L (ref 19–32)
Calcium: 9.7 mg/dL (ref 8.4–10.5)
Chloride: 99 mEq/L (ref 96–112)
Creatinine, Ser: 0.83 mg/dL (ref 0.40–1.50)
GFR: 100.72 mL/min (ref >60.00)
Glucose, Bld: 91 mg/dL (ref 70–99)
Potassium: 4.4 mEq/L (ref 3.5–5.1)
Sodium: 136 mEq/L (ref 135–145)

## 2021-01-30 LAB — LIPID PANEL
Cholesterol: 182 mg/dL (ref 0–200)
HDL: 36.5 mg/dL — ABNORMAL LOW (ref >39.00)
LDL Cholesterol: 109 mg/dL — ABNORMAL HIGH (ref 0–99)
NonHDL: 145.31
Total CHOL/HDL Ratio: 5
Triglycerides: 180 mg/dL — ABNORMAL HIGH (ref 0.0–149.0)
VLDL: 36 mg/dL (ref 0.0–40.0)

## 2021-01-30 NOTE — Addendum Note (Signed)
Addended by: Genevie Cheshire L on: 01/30/2021 02:47 PM   Modules accepted: Orders

## 2021-01-30 NOTE — Progress Notes (Signed)
   Subjective:    Patient ID: Alejandro Ayers, male    DOB: 1971-02-02, 50 y.o.   MRN: [id number]  HPI HTN- chronic problem, on Lisinopril HCTZ 20/12.5mg  daily w/ good control.  Denies CP, SOB, HAs, visual changes, edema.  Hyperlipidemia- chronic problem, on Lipitor 20mg  daily.  No abd pain, N/V.  Overweight- pt is down 5 lbs since last visit.  BMI now 25.9  Pt reports he is walking 12-15,000 steps daily at work.     Review of Systems For ROS see HPI   This visit occurred during the SARS-CoV-2 public health emergency.  Safety protocols were in place, including screening questions prior to the visit, additional usage of staff PPE, and extensive cleaning of exam room while observing appropriate contact time as indicated for disinfecting solutions.      Objective:   Physical Exam Vitals reviewed.  Constitutional:      General: He is not in acute distress.    Appearance: Normal appearance. He is well-developed. He is not ill-appearing.  HENT:     Head: Normocephalic and atraumatic.  Eyes:     Extraocular Movements: Extraocular movements intact.     Conjunctiva/sclera: Conjunctivae normal.     Pupils: Pupils are equal, round, and reactive to light.  Neck:     Thyroid: No thyromegaly.  Cardiovascular:     Rate and Rhythm: Normal rate and regular rhythm.     Pulses: Normal pulses.     Heart sounds: Normal heart sounds. No murmur heard. Pulmonary:     Effort: Pulmonary effort is normal. No respiratory distress.     Breath sounds: Normal breath sounds.  Abdominal:     General: Bowel sounds are normal. There is no distension.     Palpations: Abdomen is soft.  Musculoskeletal:     Cervical back: Normal range of motion and neck supple.     Right lower leg: No edema.     Left lower leg: No edema.  Lymphadenopathy:     Cervical: No cervical adenopathy.  Skin:    General: Skin is warm and dry.  Neurological:     General: No focal deficit present.     Mental Status: He is alert and  oriented to person, place, and time.     Cranial Nerves: No cranial nerve deficit.  Psychiatric:        Mood and Affect: Mood normal.        Behavior: Behavior normal.          Assessment & Plan:

## 2021-01-30 NOTE — Assessment & Plan Note (Signed)
Chronic problem.  Well controlled on Lisinopril HCTZ daily.  Check labs.  No anticipated med changes.  Will follow.

## 2021-01-30 NOTE — Patient Instructions (Signed)
Schedule your complete physical in 6 months We'll notify you of your lab results and make any changes if needed Continue to work on healthy diet and regular exercise- you're doing great! Call with any questions or concerns Stay Safe!  Stay Healthy! Have a great summer!!

## 2021-01-30 NOTE — Assessment & Plan Note (Signed)
Pt is down 5 lbs since last visit.  Applauded his efforts.  Will continue to follow.

## 2021-01-30 NOTE — Assessment & Plan Note (Signed)
Chronic problem.  Tolerating Lipitor w/o difficulty.  Check labs.  Adjust meds prn  

## 2021-01-31 LAB — CBC WITH DIFFERENTIAL/PLATELET
Basophils Absolute: 0.1 10*3/uL (ref 0.0–0.1)
Basophils Relative: 0.6 % (ref 0.0–3.0)
Eosinophils Absolute: 0.2 10*3/uL (ref 0.0–0.7)
Eosinophils Relative: 2.2 % (ref 0.0–5.0)
HCT: 41.8 % (ref 39.0–52.0)
Hemoglobin: 14.5 g/dL (ref 13.0–17.0)
Lymphocytes Relative: 23.4 % (ref 12.0–46.0)
Lymphs Abs: 2.6 10*3/uL (ref 0.7–4.0)
MCHC: 34.8 g/dL (ref 30.0–36.0)
MCV: 90.6 fl (ref 78.0–100.0)
Monocytes Absolute: 1.1 10*3/uL — ABNORMAL HIGH (ref 0.1–1.0)
Monocytes Relative: 9.6 % (ref 3.0–12.0)
Neutro Abs: 7.1 10*3/uL (ref 1.4–7.7)
Neutrophils Relative %: 64.2 % (ref 43.0–77.0)
Platelets: 331 10*3/uL (ref 150.0–400.0)
RBC: 4.62 Mil/uL (ref 4.22–5.81)
RDW: 13.8 % (ref 11.5–15.5)
WBC: 11.1 10*3/uL — ABNORMAL HIGH (ref 4.0–10.5)

## 2021-01-31 LAB — TSH: TSH: 2.1 u[IU]/mL (ref 0.35–5.50)

## 2021-02-11 ENCOUNTER — Other Ambulatory Visit: Payer: Self-pay | Admitting: Family Medicine

## 2021-02-12 ENCOUNTER — Encounter: Payer: Self-pay | Admitting: Dermatology

## 2021-02-12 ENCOUNTER — Ambulatory Visit: Payer: Commercial Managed Care - PPO | Admitting: Dermatology

## 2021-02-12 ENCOUNTER — Other Ambulatory Visit: Payer: Self-pay

## 2021-02-12 DIAGNOSIS — D23122 Other benign neoplasm of skin of left lower eyelid, including canthus: Secondary | ICD-10-CM

## 2021-02-12 DIAGNOSIS — D1801 Hemangioma of skin and subcutaneous tissue: Secondary | ICD-10-CM | POA: Diagnosis not present

## 2021-02-12 DIAGNOSIS — L739 Follicular disorder, unspecified: Secondary | ICD-10-CM

## 2021-02-12 DIAGNOSIS — D239 Other benign neoplasm of skin, unspecified: Secondary | ICD-10-CM

## 2021-02-12 DIAGNOSIS — D2371 Other benign neoplasm of skin of right lower limb, including hip: Secondary | ICD-10-CM | POA: Diagnosis not present

## 2021-02-12 DIAGNOSIS — D23112 Other benign neoplasm of skin of right lower eyelid, including canthus: Secondary | ICD-10-CM

## 2021-02-12 DIAGNOSIS — R238 Other skin changes: Secondary | ICD-10-CM

## 2021-02-12 DIAGNOSIS — Z1283 Encounter for screening for malignant neoplasm of skin: Secondary | ICD-10-CM

## 2021-02-15 ENCOUNTER — Ambulatory Visit: Payer: Commercial Managed Care - PPO

## 2021-03-01 ENCOUNTER — Encounter: Payer: Self-pay | Admitting: Dermatology

## 2021-03-01 NOTE — Progress Notes (Signed)
   Follow-Up Visit   Subjective  Alejandro Ayers is a 50 y.o. male who presents for the following: Annual Exam (No history of atypical moles or skin cancer, no concerns).  Skin examination, several areas to check Location:  Duration:  Quality:  Associated Signs/Symptoms: Modifying Factors:  Severity:  Timing: Context:   Objective  Well appearing patient in no apparent distress; mood and affect are within normal limits. Full body skin exam.  Atypical pigmented lesions or nonmelanoma skin cancer.  Multiple 1 mm smooth red dermal papules  Right Lower Leg - Anterior Firm 5 mm pink dermal papule  Left Lower Eyelid, Right Lower Eyelid Half dozen 1 mm barely raised light dermal papules under each eye, compatible with syringoma's.  Neck - Posterior Half dozen inflamed pink papules    A full examination was performed including scalp, head, eyes, ears, nose, lips, neck, chest, axillae, abdomen, back, buttocks, bilateral upper extremities, bilateral lower extremities, hands, feet, fingers, toes, fingernails, and toenails. All findings within normal limits unless otherwise noted below.   Assessment & Plan    Screening for malignant neoplasm of skin  Yearly skin exam.  Encouraged to self examine twice annually.  Continued ultraviolet protection.  Hemangioma of skin  No intervention necessary  Dermatofibroma Right Lower Leg - Anterior  Leave if stable  Syringoma (2) Left Lower Eyelid; Right Lower Eyelid  Discussed the lack of an excellent, effective procedure as well as the absence of any effective topical therapy for this.  No intervention planned.  Folliculitis Neck - Posterior  May try an over-the-counter benzyl peroxide wash daily on the area prone to get these bumps.  If this fails, he can call me and we can try a prescription topical.      I, Lavonna Monarch, MD, have reviewed all documentation for this visit.  The documentation on 03/01/21 for the exam,  diagnosis, procedures, and orders are all accurate and complete.

## 2021-03-08 ENCOUNTER — Ambulatory Visit: Payer: Commercial Managed Care - PPO

## 2021-03-08 ENCOUNTER — Other Ambulatory Visit: Payer: Self-pay

## 2021-04-12 ENCOUNTER — Ambulatory Visit: Payer: Commercial Managed Care - PPO

## 2021-04-17 ENCOUNTER — Other Ambulatory Visit: Payer: Self-pay

## 2021-04-17 ENCOUNTER — Ambulatory Visit (INDEPENDENT_AMBULATORY_CARE_PROVIDER_SITE_OTHER): Payer: Commercial Managed Care - PPO | Admitting: *Deleted

## 2021-04-17 DIAGNOSIS — Z23 Encounter for immunization: Secondary | ICD-10-CM

## 2021-04-17 NOTE — Progress Notes (Signed)
Pt came in the office today, administered 2nd shingle shot on the right deltoid. Pt tolerated well.

## 2021-05-13 ENCOUNTER — Telehealth: Payer: Self-pay | Admitting: Family Medicine

## 2021-05-13 NOTE — Telephone Encounter (Signed)
..  Type of form received:Health Screening for [facility]  Additional comments:   Received by:  Tye Savoy should be Faxed/mailed to: (address/ fax #)  [phone]  Is patient requesting call for pickup: No  Form placed:  In Dr. Virgil Benedict bin up front  Attach charge sheet.  Provider will determine charge.  Individual made aware of 3-5 business day turn around Yes?

## 2021-05-14 NOTE — Telephone Encounter (Signed)
Form completed and placed in basket  

## 2021-07-29 ENCOUNTER — Other Ambulatory Visit: Payer: Self-pay | Admitting: Family Medicine

## 2021-08-02 ENCOUNTER — Encounter: Payer: Commercial Managed Care - PPO | Admitting: Family Medicine

## 2021-09-06 ENCOUNTER — Encounter: Payer: Self-pay | Admitting: Family Medicine

## 2021-09-06 ENCOUNTER — Ambulatory Visit (INDEPENDENT_AMBULATORY_CARE_PROVIDER_SITE_OTHER): Payer: Commercial Managed Care - PPO | Admitting: Family Medicine

## 2021-09-06 ENCOUNTER — Telehealth: Payer: Self-pay

## 2021-09-06 VITALS — BP 126/68 | HR 84 | Temp 98.6°F | Resp 16 | Ht 72.0 in | Wt 189.0 lb

## 2021-09-06 DIAGNOSIS — Z0000 Encounter for general adult medical examination without abnormal findings: Secondary | ICD-10-CM

## 2021-09-06 DIAGNOSIS — I10 Essential (primary) hypertension: Secondary | ICD-10-CM

## 2021-09-06 DIAGNOSIS — Z125 Encounter for screening for malignant neoplasm of prostate: Secondary | ICD-10-CM | POA: Diagnosis not present

## 2021-09-06 DIAGNOSIS — R200 Anesthesia of skin: Secondary | ICD-10-CM

## 2021-09-06 LAB — BASIC METABOLIC PANEL
BUN: 25 mg/dL — ABNORMAL HIGH (ref 6–23)
CO2: 30 mEq/L (ref 19–32)
Calcium: 9.6 mg/dL (ref 8.4–10.5)
Chloride: 103 mEq/L (ref 96–112)
Creatinine, Ser: 0.91 mg/dL (ref 0.40–1.50)
GFR: 96.58 mL/min (ref >60.00)
Glucose, Bld: 79 mg/dL (ref 70–99)
Potassium: 4.8 mEq/L (ref 3.5–5.1)
Sodium: 138 mEq/L (ref 135–145)

## 2021-09-06 LAB — LIPID PANEL
Cholesterol: 137 mg/dL (ref 0–200)
HDL: 33.8 mg/dL — ABNORMAL LOW (ref >39.00)
LDL Cholesterol: 91 mg/dL (ref 0–99)
NonHDL: 103.32
Total CHOL/HDL Ratio: 4
Triglycerides: 63 mg/dL (ref 0.0–149.0)
VLDL: 12.6 mg/dL (ref 0.0–40.0)

## 2021-09-06 LAB — CBC WITH DIFFERENTIAL/PLATELET
Basophils Absolute: 0.1 10*3/uL (ref 0.0–0.1)
Basophils Relative: 0.4 % (ref 0.0–3.0)
Eosinophils Absolute: 0.2 10*3/uL (ref 0.0–0.7)
Eosinophils Relative: 1.8 % (ref 0.0–5.0)
HCT: 40.6 % (ref 39.0–52.0)
Hemoglobin: 13.6 g/dL (ref 13.0–17.0)
Lymphocytes Relative: 18.1 % (ref 12.0–46.0)
Lymphs Abs: 2.1 10*3/uL (ref 0.7–4.0)
MCHC: 33.6 g/dL (ref 30.0–36.0)
MCV: 91.5 fl (ref 78.0–100.0)
Monocytes Absolute: 1 10*3/uL (ref 0.1–1.0)
Monocytes Relative: 8.6 % (ref 3.0–12.0)
Neutro Abs: 8.4 10*3/uL — ABNORMAL HIGH (ref 1.4–7.7)
Neutrophils Relative %: 71.1 % (ref 43.0–77.0)
Platelets: 339 10*3/uL (ref 150.0–400.0)
RBC: 4.44 Mil/uL (ref 4.22–5.81)
RDW: 14.4 % (ref 11.5–15.5)
WBC: 11.8 10*3/uL — ABNORMAL HIGH (ref 4.0–10.5)

## 2021-09-06 LAB — HEPATIC FUNCTION PANEL
ALT: 15 U/L (ref 0–53)
AST: 16 U/L (ref 0–37)
Albumin: 4.4 g/dL (ref 3.5–5.2)
Alkaline Phosphatase: 76 U/L (ref 39–117)
Bilirubin, Direct: 0.1 mg/dL (ref 0.0–0.3)
Total Bilirubin: 0.4 mg/dL (ref 0.2–1.2)
Total Protein: 6.8 g/dL (ref 6.0–8.3)

## 2021-09-06 LAB — TSH: TSH: 0.89 u[IU]/mL (ref 0.35–5.50)

## 2021-09-06 LAB — PSA: PSA: 0.79 ng/mL (ref 0.10–4.00)

## 2021-09-06 NOTE — Telephone Encounter (Signed)
Pt aware of labs  

## 2021-09-06 NOTE — Assessment & Plan Note (Signed)
Pt's PE WNL.  UTD on colonoscopy, immunizations.  Check labs.  Anticipatory guidance provided.  

## 2021-09-06 NOTE — Patient Instructions (Addendum)
Follow up in 6 months to recheck BP and cholesterol We'll notify you of your lab results and make any changes if needed Keep up the good work on healthy diet and regular exercise- you're doing great!! We'll call you with your Ortho appt for the arm numbness and dexterity Call with any questions or concerns Stay Safe!  Stay Healthy! Happy Early Rudene Anda!!!

## 2021-09-06 NOTE — Progress Notes (Signed)
° °  Subjective:    Patient ID: Alejandro Ayers, male    DOB: [birth year], 51 y.o.   MRN: [id number]  HPI CPE- UTD on colonoscopy, Tdap, PNA, shingrix, flu  Health Maintenance  Topic Date Due   Hepatitis C Screening  Never done   COVID-19 Vaccine (3 - Moderna risk series) 11/23/2019   COLONOSCOPY (Pts 45-3yrs Insurance coverage will need to be confirmed)  08/02/2023   TETANUS/TDAP  12/30/2025   INFLUENZA VACCINE  Completed   HIV Screening  Completed   Zoster Vaccines- Shingrix  Completed   HPV VACCINES  Aged Out      Review of Systems Patient reports no vision/hearing changes, anorexia, fever ,adenopathy, persistant/recurrent hoarseness, swallowing issues, chest pain, palpitations, edema, persistant/recurrent cough, hemoptysis, dyspnea (rest,exertional, paroxysmal nocturnal), gastrointestinal  bleeding (melena, rectal bleeding), abdominal pain, excessive heart burn, GU symptoms (dysuria, hematuria, voiding/incontinence issues) syncope, focal weakness, memory loss, skin/hair/nail changes, depression, anxiety, abnormal bruising/bleeding, musculoskeletal symptoms/signs.   + R arm numbness- had previous shoulder surgery w/ Dr Lorin Mercy Losing dexterity in hands  This visit occurred during the SARS-CoV-2 public health emergency.  Safety protocols were in place, including screening questions prior to the visit, additional usage of staff PPE, and extensive cleaning of exam room while observing appropriate contact time as indicated for disinfecting solutions.      Objective:   Physical Exam General Appearance:    Alert, cooperative, no distress, appears stated age  Head:    Normocephalic, without obvious abnormality, atraumatic  Eyes:    PERRL, conjunctiva/corneas clear, EOM's intact, fundi    benign, both eyes       Ears:    Normal TM's and external ear canals, both ears  Nose:   Deferred due to COVID  Throat:   Neck:   Supple, symmetrical, trachea midline, no adenopathy;       thyroid:  No  enlargement/tenderness/nodules  Back:     Symmetric, no curvature, ROM normal, no CVA tenderness  Lungs:     Clear to auscultation bilaterally, respirations unlabored  Chest wall:    No tenderness or deformity  Heart:    Regular rate and rhythm, S1 and S2 normal, no murmur, rub   or gallop  Abdomen:     Soft, non-tender, bowel sounds active all four quadrants,    no masses, no organomegaly  Genitalia:    Deferred  Rectal:    Extremities:   Extremities normal, atraumatic, no cyanosis or edema  Pulses:   2+ and symmetric all extremities  Skin:   Skin color, texture, turgor normal, no rashes or lesions  Lymph nodes:   Cervical, supraclavicular, and axillary nodes normal  Neurologic:   CNII-XII intact. Normal strength, sensation and reflexes      throughout          Assessment & Plan:

## 2021-09-06 NOTE — Assessment & Plan Note (Signed)
Chronic problem.  Well controlled.  Currently asymptomatic.  Check labs.  No expected med changes.  Will follow.

## 2021-09-06 NOTE — Telephone Encounter (Signed)
-----   Message from Midge Minium, MD sent at 09/06/2021  3:48 PM EST ----- Labs look great!  No changes at this time

## 2021-09-18 ENCOUNTER — Ambulatory Visit: Payer: Commercial Managed Care - PPO | Admitting: Orthopaedic Surgery

## 2021-09-18 ENCOUNTER — Encounter: Payer: Self-pay | Admitting: Orthopaedic Surgery

## 2021-09-18 ENCOUNTER — Other Ambulatory Visit: Payer: Self-pay

## 2021-09-18 DIAGNOSIS — G5601 Carpal tunnel syndrome, right upper limb: Secondary | ICD-10-CM | POA: Diagnosis not present

## 2021-09-18 NOTE — Progress Notes (Signed)
? ?Office Visit Note ?  ?Patient: Alejandro Ayers           ?Date of Birth: [birth year]           ?MRN: [id number] ?Visit Date: 09/18/2021 ?             ?Requested by: Midge Minium, MD ?[address] ?[place],  [place] [address] ?PCP: Midge Minium, MD ? ? ?Assessment & Plan: ?Visit Diagnoses:  ?1. Carpal tunnel syndrome, right upper limb   ? ? ?Plan: Patient is already done splinting.  We offered him carpal tunnel injection he states he would rather proceed with surgery since it is gradually getting worse over the last 8 months in his opposite left hand has not bothered him since the surgery.  He like to set this up after Easter as an outpatient.  Procedure discussed all questions were answered he understands request to proceed. ? ?Follow-Up Instructions: No follow-ups on file.  ? ?Orders:  ?No orders of the defined types were placed in this encounter. ? ?No orders of the defined types were placed in this encounter. ? ? ? ? Procedures: ?No procedures performed ? ? ?Clinical Data: ?No additional findings. ? ? ?Subjective: ?Chief Complaint  ?Patient presents with  ? Right Hand - Numbness  ? ? ?HPI 51 year old male returns with past history of a left carpal tunnel release 3 years ago by me with progressive symptoms now in his right hand.  Right hand goes to sleep he wakes up at night has to shake his hand.  He has used a splint without relief.  Symptoms have progressed over the last 8 months.  He notices it particularly when he was skiing also has problems holding the steering wheel or with work activities.  Opposite left hand to have carpal tunnel release doing well with no symptoms. ? ?Previous history of right shoulder debridement biceps tenodesis rotator cuff repair 2020 doing well.  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 ? ?Past history of hypertension hypercholesterolemia and rotator cuff surgery. ? ?Review of Systems 14 point systems negative other than as mentioned in  HPI. ? ? ?Objective: ?Vital Signs: BP 115/75   Pulse 91   Ht 6' (1.829 m)   Wt 191 lb (86.6 kg)   BMI 25.90 kg/m?  ? ?Physical Exam ?Constitutional:   ?   Appearance: He is well-developed.  ?HENT:  ?   Head: Normocephalic and atraumatic.  ?   Right Ear: External ear normal.  ?   Left Ear: External ear normal.  ?Eyes:  ?   Pupils: Pupils are equal, round, and reactive to light.  ?Neck:  ?   Thyroid: No thyromegaly.  ?   Trachea: No tracheal deviation.  ?Cardiovascular:  ?   Rate and Rhythm: Normal rate.  ?Pulmonary:  ?   Effort: Pulmonary effort is normal.  ?   Breath sounds: No wheezing.  ?Abdominal:  ?   General: Bowel sounds are normal.  ?   Palpations: Abdomen is soft.  ?Musculoskeletal:  ?   Cervical back: Neck supple.  ?Skin: ?   General: Skin is warm and dry.  ?   Capillary Refill: Capillary refill takes less than 2 seconds.  ?Neurological:  ?   Mental Status: He is alert and oriented to person, place, and time.  ?Psychiatric:     ?   Behavior: Behavior normal.     ?   Thought Content: Thought content normal.     ?  Judgment: Judgment normal.  ? ? ?Ortho Exam full cervical range of motion upper extremity reflexes are 2+.  He has mild thenar weakness on the right negative on the left mild thenar atrophy on the right negative on the left positive carpal compression test positive Phalen's on the right.  Ulnar nerve interossei motor is normal.  Ulnar nerve at the elbow is normal no brachial plexus tenderness. ? ?Well-healed the left carpal tunnel incision. ? ?Specialty Comments:  ?No specialty comments available. ? ?Imaging: ?No results found. ? ? ?PMFS History: ?Patient Active Problem List  ? Diagnosis Date Noted  ? Carpal tunnel syndrome, right upper limb 09/18/2021  ? Hyperlipidemia 01/30/2021  ? Irritable bowel syndrome without diarrhea 01/30/2021  ? Overweight (BMI 25.0-29.9) 01/12/2020  ? Biceps tendonosis of right shoulder 05/24/2019  ? Superior labrum anterior-to-posterior (SLAP) tear of right  shoulder 05/24/2019  ? Supraspinatus tendon tear 05/24/2019  ? Carpal tunnel syndrome, left upper limb 02/20/2019  ? Impingement syndrome of right shoulder 02/20/2019  ? Bilateral hand numbness 01/25/2019  ? Adenomatous polyp of colon 04/21/2016  ? Right elbow tendinitis 06/12/2015  ? Physical exam 12/18/2014  ? Essential hypertension, benign 09/25/2012  ? Low testosterone 09/25/2012  ? Pure hypercholesterolemia 09/25/2012  ? Tobacco abuse 09/25/2012  ? History of anemia 09/25/2012  ? ?Past Medical History:  ?Diagnosis Date  ? Eczema   ? Hyperlipidemia   ? Hypertension   ? Tennis elbow   ? Had a cortisone injection in left elbow in the beginning of 2019  ?  ?Family History  ?Problem Relation Age of Onset  ? Hypertension Father   ? CVA Sister   ?     after head injury  ? Prostate cancer Neg Hx   ? Colon cancer Neg Hx   ?  ?Past Surgical History:  ?Procedure Laterality Date  ? GANGLION CYST EXCISION  11/22/08  ? ROTATOR CUFF REPAIR    ? WISDOM TOOTH EXTRACTION    ? ?Social History  ? ?Occupational History  ? Not on file  ?Tobacco Use  ? Smoking status: Former  ?  Packs/day: 1.00  ?  Types: Cigarettes  ?  Quit date: 09/13/2018  ?  Years since quitting: 3.0  ? Smokeless tobacco: Never  ?Vaping Use  ? Vaping Use: Never used  ?Substance and Sexual Activity  ? Alcohol use: Yes  ?  Alcohol/week: 0.0 standard drinks  ? Drug use: No  ? Sexual activity: Yes  ? ? ? ? ? ? ?

## 2021-12-13 ENCOUNTER — Telehealth: Payer: Self-pay | Admitting: Orthopaedic Surgery

## 2021-12-13 NOTE — Telephone Encounter (Signed)
Received $25.00 check,medical records release form and disability paperwork/Forwarding to [facility] today

## 2021-12-23 ENCOUNTER — Other Ambulatory Visit: Payer: Self-pay | Admitting: Orthopaedic Surgery

## 2021-12-23 DIAGNOSIS — G5601 Carpal tunnel syndrome, right upper limb: Secondary | ICD-10-CM | POA: Diagnosis not present

## 2021-12-23 MED ORDER — HYDROCODONE-ACETAMINOPHEN 5-325 MG PO TABS: 1.0000 | ORAL_TABLET | Freq: Four times a day (QID) | ORAL | 0 refills | Status: DC | PRN

## 2021-12-31 ENCOUNTER — Ambulatory Visit (INDEPENDENT_AMBULATORY_CARE_PROVIDER_SITE_OTHER): Payer: Commercial Managed Care - PPO | Admitting: Orthopaedic Surgery

## 2021-12-31 VITALS — BP 113/77 | HR 106

## 2021-12-31 DIAGNOSIS — G5601 Carpal tunnel syndrome, right upper limb: Secondary | ICD-10-CM

## 2021-12-31 NOTE — Progress Notes (Signed)
   Post-Op Visit Note   Patient: Alejandro Ayers           Date of Birth: Mar 22, 1971           MRN: [id number] Visit Date: 12/31/2021 PCP: Midge Minium, MD   Assessment & Plan: Follow-up carpal tunnel release.  He is already had his opposite left hand done.  Sutures look good.  Splint given since he did not have one for the right wrist he can use his when he is active to protect the incision.  Return in 1 week for wound check and likely suture removal.  Work slip given no work x6 weeks.  Chief Complaint:  Chief Complaint  Patient presents with   Right Hand - Routine Post Op   Visit Diagnoses:  1. Carpal tunnel syndrome, right upper limb     Plan: Return 1 week.  Follow-Up Instructions: No follow-ups on file.   Orders:  No orders of the defined types were placed in this encounter.  No orders of the defined types were placed in this encounter.   Imaging: No results found.  PMFS History: Patient Active Problem List   Diagnosis Date Noted   Carpal tunnel syndrome, right upper limb 09/18/2021   Hyperlipidemia 01/30/2021   Irritable bowel syndrome without diarrhea 01/30/2021   Overweight (BMI 25.0-29.9) 01/12/2020   Biceps tendonosis of right shoulder 05/24/2019   Superior labrum anterior-to-posterior (SLAP) tear of right shoulder 05/24/2019   Supraspinatus tendon tear 05/24/2019   Carpal tunnel syndrome, left upper limb 02/20/2019   Impingement syndrome of right shoulder 02/20/2019   Bilateral hand numbness 01/25/2019   Adenomatous polyp of colon 04/21/2016   Right elbow tendinitis 06/12/2015   Physical exam 12/18/2014   Essential hypertension, benign 09/25/2012   Low testosterone 09/25/2012   Pure hypercholesterolemia 09/25/2012   Tobacco abuse 09/25/2012   History of anemia 09/25/2012   Past Medical History:  Diagnosis Date   Eczema    Hyperlipidemia    Hypertension    Tennis elbow    Had a cortisone injection in left elbow in the beginning of 2019     Family History  Problem Relation Age of Onset   Hypertension Father    CVA Sister        after head injury   Prostate cancer Neg Hx    Colon cancer Neg Hx     Past Surgical History:  Procedure Laterality Date   GANGLION CYST EXCISION  11/22/08   ROTATOR CUFF REPAIR     WISDOM TOOTH EXTRACTION     Social History   Occupational History   Not on file  Tobacco Use   Smoking status: Former    Packs/day: 1.00    Types: Cigarettes    Quit date: 09/13/2018    Years since quitting: 3.3   Smokeless tobacco: Never  Vaping Use   Vaping Use: Never used  Substance and Sexual Activity   Alcohol use: Yes    Alcohol/week: 0.0 standard drinks of alcohol   Drug use: No   Sexual activity: Yes

## 2022-01-08 ENCOUNTER — Ambulatory Visit: Payer: Commercial Managed Care - PPO

## 2022-01-30 ENCOUNTER — Other Ambulatory Visit: Payer: Self-pay | Admitting: Family Medicine

## 2022-02-05 ENCOUNTER — Ambulatory Visit (INDEPENDENT_AMBULATORY_CARE_PROVIDER_SITE_OTHER): Payer: Commercial Managed Care - PPO | Admitting: Orthopaedic Surgery

## 2022-02-05 DIAGNOSIS — G5601 Carpal tunnel syndrome, right upper limb: Secondary | ICD-10-CM

## 2022-02-05 NOTE — Progress Notes (Signed)
   Post-Op Visit Note   Patient: Alejandro Ayers           Date of Birth: [birth year]           MRN: [id number] Visit Date: 02/05/2022 PCP: Midge Minium, MD   Assessment & Plan: Follow-up carpal tunnel release see sleeping well numbness is improved work slip given for return to work Monday at his request without restrictions return as needed.  Chief Complaint:  Chief Complaint  Patient presents with   Right Hand - Routine Post Op   Visit Diagnoses:  1. Carpal tunnel syndrome, right upper limb     Plan: Patient happy the surgical result reserve return as needed.  Follow-Up Instructions: No follow-ups on file.   Orders:  No orders of the defined types were placed in this encounter.  No orders of the defined types were placed in this encounter.   Imaging: No results found.  PMFS History: Patient Active Problem List   Diagnosis Date Noted   Carpal tunnel syndrome, right upper limb 09/18/2021   Hyperlipidemia 01/30/2021   Irritable bowel syndrome without diarrhea 01/30/2021   Overweight (BMI 25.0-29.9) 01/12/2020   Biceps tendonosis of right shoulder 05/24/2019   Superior labrum anterior-to-posterior (SLAP) tear of right shoulder 05/24/2019   Supraspinatus tendon tear 05/24/2019   Carpal tunnel syndrome, left upper limb 02/20/2019   Impingement syndrome of right shoulder 02/20/2019   Bilateral hand numbness 01/25/2019   Adenomatous polyp of colon 04/21/2016   Right elbow tendinitis 06/12/2015   Physical exam 12/18/2014   Essential hypertension, benign 09/25/2012   Low testosterone 09/25/2012   Pure hypercholesterolemia 09/25/2012   Tobacco abuse 09/25/2012   History of anemia 09/25/2012   Past Medical History:  Diagnosis Date   Eczema    Hyperlipidemia    Hypertension    Tennis elbow    Had a cortisone injection in left elbow in the beginning of 2019    Family History  Problem Relation Age of Onset   Hypertension Father    CVA Sister        after  head injury   Prostate cancer Neg Hx    Colon cancer Neg Hx     Past Surgical History:  Procedure Laterality Date   GANGLION CYST EXCISION  11/22/08   ROTATOR CUFF REPAIR     WISDOM TOOTH EXTRACTION     Social History   Occupational History   Not on file  Tobacco Use   Smoking status: Former    Packs/day: 1.00    Types: Cigarettes    Quit date: 09/13/2018    Years since quitting: 3.4   Smokeless tobacco: Never  Vaping Use   Vaping Use: Never used  Substance and Sexual Activity   Alcohol use: Yes    Alcohol/week: 0.0 standard drinks of alcohol   Drug use: No   Sexual activity: Yes

## 2022-03-07 ENCOUNTER — Ambulatory Visit: Payer: Commercial Managed Care - PPO | Admitting: Family Medicine

## 2022-03-07 ENCOUNTER — Encounter: Payer: Self-pay | Admitting: Family Medicine

## 2022-03-07 VITALS — BP 118/70 | HR 71 | Temp 98.6°F | Resp 20 | Ht 72.0 in | Wt 187.6 lb

## 2022-03-07 DIAGNOSIS — E785 Hyperlipidemia, unspecified: Secondary | ICD-10-CM

## 2022-03-07 DIAGNOSIS — E663 Overweight: Secondary | ICD-10-CM | POA: Diagnosis not present

## 2022-03-07 DIAGNOSIS — I10 Essential (primary) hypertension: Secondary | ICD-10-CM

## 2022-03-07 LAB — BASIC METABOLIC PANEL
BUN: 20 mg/dL (ref 6–23)
CO2: 29 mEq/L (ref 19–32)
Calcium: 9.6 mg/dL (ref 8.4–10.5)
Chloride: 102 mEq/L (ref 96–112)
Creatinine, Ser: 0.97 mg/dL (ref 0.40–1.50)
GFR: 89.15 mL/min (ref >60.00)
Glucose, Bld: 96 mg/dL (ref 70–99)
Potassium: 4.3 mEq/L (ref 3.5–5.1)
Sodium: 137 mEq/L (ref 135–145)

## 2022-03-07 LAB — TSH: TSH: 1.1 u[IU]/mL (ref 0.35–5.50)

## 2022-03-07 LAB — CBC WITH DIFFERENTIAL/PLATELET
Basophils Absolute: 0.1 10*3/uL (ref 0.0–0.1)
Basophils Relative: 0.6 % (ref 0.0–3.0)
Eosinophils Absolute: 0.3 10*3/uL (ref 0.0–0.7)
Eosinophils Relative: 3.1 % (ref 0.0–5.0)
HCT: 42 % (ref 39.0–52.0)
Hemoglobin: 14.1 g/dL (ref 13.0–17.0)
Lymphocytes Relative: 23.6 % (ref 12.0–46.0)
Lymphs Abs: 2.1 10*3/uL (ref 0.7–4.0)
MCHC: 33.6 g/dL (ref 30.0–36.0)
MCV: 93.1 fl (ref 78.0–100.0)
Monocytes Absolute: 1 10*3/uL (ref 0.1–1.0)
Monocytes Relative: 10.7 % (ref 3.0–12.0)
Neutro Abs: 5.5 10*3/uL (ref 1.4–7.7)
Neutrophils Relative %: 62 % (ref 43.0–77.0)
Platelets: 339 10*3/uL (ref 150.0–400.0)
RBC: 4.5 Mil/uL (ref 4.22–5.81)
RDW: 14.5 % (ref 11.5–15.5)
WBC: 8.9 10*3/uL (ref 4.0–10.5)

## 2022-03-07 LAB — HEPATIC FUNCTION PANEL
ALT: 15 U/L (ref 0–53)
AST: 17 U/L (ref 0–37)
Albumin: 4.5 g/dL (ref 3.5–5.2)
Alkaline Phosphatase: 82 U/L (ref 39–117)
Bilirubin, Direct: 0.1 mg/dL (ref 0.0–0.3)
Total Bilirubin: 0.4 mg/dL (ref 0.2–1.2)
Total Protein: 7.1 g/dL (ref 6.0–8.3)

## 2022-03-07 LAB — LIPID PANEL
Cholesterol: 144 mg/dL (ref 0–200)
HDL: 37.2 mg/dL — ABNORMAL LOW (ref >39.00)
LDL Cholesterol: 95 mg/dL (ref 0–99)
NonHDL: 106.49
Total CHOL/HDL Ratio: 4
Triglycerides: 56 mg/dL (ref 0.0–149.0)
VLDL: 11.2 mg/dL (ref 0.0–40.0)

## 2022-03-07 NOTE — Assessment & Plan Note (Signed)
Chronic problem.  Currently well controlled on Lisinopril HCTZ 20/12.'5mg'$  daily.  Asymptomatic.  Check labs due to ACE and diuretic use but no anticipated med changes.

## 2022-03-07 NOTE — Assessment & Plan Note (Signed)
Chronic problem.  Currently on Lipitor 26m daily w/o difficulty.  Check labs.  Adjust meds prn

## 2022-03-07 NOTE — Progress Notes (Signed)
   Subjective:    Patient ID: Alejandro Ayers, male    DOB: [birth year], 51 y.o.   MRN: [id number]  HPI HTN- chronic problem, on Lisinopril HCTZ 20/12.'5mg'$  daily w/ good control.  No CP, SOB, HAs, visual changes, edema.  Hyperlipidemia- chronic problem, on Lipitor '20mg'$  daily.  No abd pain, N/V.  Overweight- pt is down 4 lbs since last visit.  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   Review of Systems For ROS see HPI     Objective:   Physical Exam Vitals reviewed.  Constitutional:      General: He is not in acute distress.    Appearance: Normal appearance. He is well-developed. He is not ill-appearing.  HENT:     Head: Normocephalic and atraumatic.  Eyes:     Extraocular Movements: Extraocular movements intact.     Conjunctiva/sclera: Conjunctivae normal.     Pupils: Pupils are equal, round, and reactive to light.  Neck:     Thyroid: No thyromegaly.  Cardiovascular:     Rate and Rhythm: Normal rate and regular rhythm.     Pulses: Normal pulses.     Heart sounds: Normal heart sounds. No murmur heard. Pulmonary:     Effort: Pulmonary effort is normal. No respiratory distress.     Breath sounds: Normal breath sounds.  Abdominal:     General: Bowel sounds are normal. There is no distension.     Palpations: Abdomen is soft.  Musculoskeletal:     Cervical back: Normal range of motion and neck supple.     Right lower leg: No edema.     Left lower leg: No edema.  Lymphadenopathy:     Cervical: No cervical adenopathy.  Skin:    General: Skin is warm and dry.  Neurological:     General: No focal deficit present.     Mental Status: He is alert and oriented to person, place, and time.     Cranial Nerves: No cranial nerve deficit.  Psychiatric:        Mood and Affect: Mood normal.        Behavior: Behavior normal.           Assessment & Plan:

## 2022-03-07 NOTE — Patient Instructions (Signed)
Schedule your complete physical in 6 months We'll notify you of your lab results and make any changes if needed Keep up the good work on healthy diet and regular exercise- you look great! Call with any questions or concerns Stay Safe!  Stay Healthy! Enjoy the rest of your summer!!!

## 2022-03-07 NOTE — Assessment & Plan Note (Signed)
Pt is down 4 lbs since last visit.  Pt is eating better and getting more regular exercise.  Will continue to follow.

## 2022-03-11 NOTE — Progress Notes (Signed)
Informed pt of lab results  

## 2022-07-27 ENCOUNTER — Other Ambulatory Visit: Payer: Self-pay | Admitting: Family Medicine

## 2022-09-12 ENCOUNTER — Encounter: Payer: Commercial Managed Care - PPO | Admitting: Family Medicine

## 2022-10-22 ENCOUNTER — Ambulatory Visit (INDEPENDENT_AMBULATORY_CARE_PROVIDER_SITE_OTHER): Payer: Commercial Managed Care - PPO | Admitting: Family Medicine

## 2022-10-22 ENCOUNTER — Encounter: Payer: Self-pay | Admitting: Family Medicine

## 2022-10-22 VITALS — BP 118/78 | HR 98 | Temp 98.7°F | Resp 18 | Ht 72.0 in | Wt 192.5 lb

## 2022-10-22 DIAGNOSIS — I10 Essential (primary) hypertension: Secondary | ICD-10-CM

## 2022-10-22 DIAGNOSIS — Z125 Encounter for screening for malignant neoplasm of prostate: Secondary | ICD-10-CM

## 2022-10-22 DIAGNOSIS — Z0000 Encounter for general adult medical examination without abnormal findings: Secondary | ICD-10-CM

## 2022-10-22 NOTE — Assessment & Plan Note (Signed)
Pt's PE WNL.  UTD on colonoscopy, Tdap.  Encouraged smoking cessation.  Anticipatory guidance provided. Check labs.

## 2022-10-22 NOTE — Assessment & Plan Note (Signed)
Currently well controlled.  Asymptomatic.  Check labs but no anticipated med changes.  Will follow.

## 2022-10-22 NOTE — Progress Notes (Signed)
   Subjective:    Patient ID: Alejandro Ayers, male    DOB: 09-11-1970, 52 y.o.   MRN: [id number]  HPI CPE- UTD on on colonoscopy, Tdap.  No concerns today.  No concerns today  Patient Care Team    Relationship Specialty Notifications Start End  Midge Minium, MD PCP - General Family Medicine  12/31/15     Health Maintenance  Topic Date Due   COVID-19 Vaccine (3 - Moderna risk series) 11/07/2022 (Originally 11/23/2019)   INFLUENZA VACCINE  02/19/2023   COLONOSCOPY (Pts 45-33yrs Insurance coverage will need to be confirmed)  08/02/2023   DTaP/Tdap/Td (3 - Td or Tdap) 12/31/2031   HIV Screening  Completed   Zoster Vaccines- Shingrix  Completed   HPV VACCINES  Aged Out   Hepatitis C Screening  Discontinued     Review of Systems Patient reports no vision/hearing changes, anorexia, fever ,adenopathy, persistant/recurrent hoarseness, swallowing issues, chest pain, palpitations, edema, persistant/recurrent cough, hemoptysis, dyspnea (rest,exertional, paroxysmal nocturnal), gastrointestinal  bleeding (melena, rectal bleeding), abdominal pain, excessive heart burn, GU symptoms (dysuria, hematuria, voiding/incontinence issues) syncope, focal weakness, memory loss, numbness & tingling, skin/hair/nail changes, depression, anxiety, abnormal bruising/bleeding, musculoskeletal symptoms/signs.     Objective:   Physical Exam General Appearance:    Alert, cooperative, no distress, appears stated age  Head:    Normocephalic, without obvious abnormality, atraumatic  Eyes:    PERRL, conjunctiva/corneas clear, EOM's intact both eyes       Ears:    Normal TM's and external ear canals, both ears  Nose:   Nares normal, septum midline, mucosa normal, no drainage   or sinus tenderness  Throat:   Lips, mucosa, and tongue normal; teeth and gums normal  Neck:   Supple, symmetrical, trachea midline, no adenopathy;       thyroid:  No enlargement/tenderness/nodules  Back:     Symmetric, no curvature, ROM  normal, no CVA tenderness  Lungs:     Clear to auscultation bilaterally, respirations unlabored  Chest wall:    No tenderness or deformity  Heart:    Regular rate and rhythm, S1 and S2 normal, no murmur, rub   or gallop  Abdomen:     Soft, non-tender, bowel sounds active all four quadrants,    no masses, no organomegaly  Genitalia:    deferred  Rectal:    Extremities:   Extremities normal, atraumatic, no cyanosis or edema  Pulses:   2+ and symmetric all extremities  Skin:   Skin color, texture, turgor normal, no rashes or lesions  Lymph nodes:   Cervical, supraclavicular, and axillary nodes normal  Neurologic:   CNII-XII intact. Normal strength, sensation and reflexes      throughout          Assessment & Plan:

## 2022-10-22 NOTE — Patient Instructions (Signed)
Follow up in 6 months to recheck BP and cholesterol We'll notify you of your lab results and make any changes if needed Keep up the good work on healthy diet and regular exercise- you're doing great! Try and quit smoking!  You can do it!! Call with any questions or concerns Stay Safe!  Stay Healthy! Enjoy Your Trip!!!

## 2022-10-23 LAB — CBC WITH DIFFERENTIAL/PLATELET
Basophils Absolute: 0.1 10*3/uL (ref 0.0–0.1)
Basophils Relative: 1.1 % (ref 0.0–3.0)
Eosinophils Absolute: 0.3 10*3/uL (ref 0.0–0.7)
Eosinophils Relative: 3.9 % (ref 0.0–5.0)
HCT: 42.8 % (ref 39.0–52.0)
Hemoglobin: 14.6 g/dL (ref 13.0–17.0)
Lymphocytes Relative: 33.8 % (ref 12.0–46.0)
Lymphs Abs: 2.3 10*3/uL (ref 0.7–4.0)
MCHC: 34.2 g/dL (ref 30.0–36.0)
MCV: 91.9 fl (ref 78.0–100.0)
Monocytes Absolute: 0.9 10*3/uL (ref 0.1–1.0)
Monocytes Relative: 13.1 % — ABNORMAL HIGH (ref 3.0–12.0)
Neutro Abs: 3.2 10*3/uL (ref 1.4–7.7)
Neutrophils Relative %: 48.1 % (ref 43.0–77.0)
Platelets: 330 10*3/uL (ref 150.0–400.0)
RBC: 4.65 Mil/uL (ref 4.22–5.81)
RDW: 14.4 % (ref 11.5–15.5)
WBC: 6.7 10*3/uL (ref 4.0–10.5)

## 2022-10-23 LAB — HEPATIC FUNCTION PANEL
ALT: 14 U/L (ref 0–53)
AST: 17 U/L (ref 0–37)
Albumin: 4.6 g/dL (ref 3.5–5.2)
Alkaline Phosphatase: 94 U/L (ref 39–117)
Bilirubin, Direct: 0.1 mg/dL (ref 0.0–0.3)
Total Bilirubin: 0.3 mg/dL (ref 0.2–1.2)
Total Protein: 7 g/dL (ref 6.0–8.3)

## 2022-10-23 LAB — LIPID PANEL
Cholesterol: 154 mg/dL (ref 0–200)
HDL: 37.8 mg/dL — ABNORMAL LOW (ref >39.00)
LDL Cholesterol: 85 mg/dL (ref 0–99)
NonHDL: 115.72
Total CHOL/HDL Ratio: 4
Triglycerides: 154 mg/dL — ABNORMAL HIGH (ref 0.0–149.0)
VLDL: 30.8 mg/dL (ref 0.0–40.0)

## 2022-10-23 LAB — PSA: PSA: 0.84 ng/mL (ref 0.10–4.00)

## 2022-10-23 LAB — BASIC METABOLIC PANEL
BUN: 23 mg/dL (ref 6–23)
CO2: 28 mEq/L (ref 19–32)
Calcium: 9.4 mg/dL (ref 8.4–10.5)
Chloride: 100 mEq/L (ref 96–112)
Creatinine, Ser: 0.89 mg/dL (ref 0.40–1.50)
GFR: 97.43 mL/min (ref >60.00)
Glucose, Bld: 91 mg/dL (ref 70–99)
Potassium: 4.4 mEq/L (ref 3.5–5.1)
Sodium: 135 mEq/L (ref 135–145)

## 2022-10-23 LAB — TSH: TSH: 1.1 u[IU]/mL (ref 0.35–5.50)

## 2022-10-27 ENCOUNTER — Other Ambulatory Visit: Payer: Self-pay | Admitting: Family Medicine

## 2022-11-19 ENCOUNTER — Encounter: Payer: Self-pay | Admitting: Family Medicine

## 2022-11-19 NOTE — Telephone Encounter (Signed)
In your sign folder.

## 2022-12-17 ENCOUNTER — Telehealth: Payer: Self-pay

## 2022-12-17 NOTE — Telephone Encounter (Signed)
Form signed and placed in scan

## 2022-12-17 NOTE — Telephone Encounter (Signed)
Phys Results form placed in Dr Beverely Low to be signed folder

## 2022-12-17 NOTE — Telephone Encounter (Signed)
Form completed and returned to [facility] 

## 2023-01-13 ENCOUNTER — Other Ambulatory Visit (HOSPITAL_BASED_OUTPATIENT_CLINIC_OR_DEPARTMENT_OTHER): Payer: Self-pay

## 2023-01-13 MED ORDER — AMOXICILLIN-POT CLAVULANATE 875-125 MG PO TABS
1.0000 | ORAL_TABLET | Freq: Two times a day (BID) | ORAL | 0 refills | Status: DC
  Filled 2023-01-13: qty 14, 7d supply, fill #0

## 2023-02-23 ENCOUNTER — Other Ambulatory Visit: Payer: Self-pay | Admitting: Family Medicine

## 2023-03-05 ENCOUNTER — Ambulatory Visit: Payer: Commercial Managed Care - PPO | Admitting: Dermatology

## 2023-03-05 ENCOUNTER — Encounter: Payer: Self-pay | Admitting: Dermatology

## 2023-03-05 VITALS — BP 100/65 | HR 91

## 2023-03-05 DIAGNOSIS — L814 Other melanin hyperpigmentation: Secondary | ICD-10-CM | POA: Diagnosis not present

## 2023-03-05 DIAGNOSIS — Z808 Family history of malignant neoplasm of other organs or systems: Secondary | ICD-10-CM

## 2023-03-05 DIAGNOSIS — D225 Melanocytic nevi of trunk: Secondary | ICD-10-CM | POA: Diagnosis not present

## 2023-03-05 DIAGNOSIS — L570 Actinic keratosis: Secondary | ICD-10-CM

## 2023-03-05 DIAGNOSIS — Z1283 Encounter for screening for malignant neoplasm of skin: Secondary | ICD-10-CM | POA: Diagnosis not present

## 2023-03-05 DIAGNOSIS — W908XXA Exposure to other nonionizing radiation, initial encounter: Secondary | ICD-10-CM

## 2023-03-05 DIAGNOSIS — D485 Neoplasm of uncertain behavior of skin: Secondary | ICD-10-CM

## 2023-03-05 DIAGNOSIS — L578 Other skin changes due to chronic exposure to nonionizing radiation: Secondary | ICD-10-CM

## 2023-03-05 DIAGNOSIS — D229 Melanocytic nevi, unspecified: Secondary | ICD-10-CM

## 2023-03-05 DIAGNOSIS — L821 Other seborrheic keratosis: Secondary | ICD-10-CM

## 2023-03-05 NOTE — Patient Instructions (Addendum)
Dear Patient,  Thank you for visiting my office today. Your dedication to maintaining and improving your skin health is greatly appreciated. Below is a summary of the key points from our discussion and the instructions for your ongoing care:  - Skin Examination: Actinic keratosis has been identified on your scalp and ear, which requires treatment to prevent further progression.  - Sun Protection: With your new pool and the resultant increased exposure to the sun, please adhere to the following:   - Continue wearing UV protective swimming shirts.   - Ensure you reapply sunscreen every three hours while outdoors.   2 Abnormal appearing Moles - Assessment:   - A: 8mm irregular brown macule observed on the mid-abdomen.   - B: 9mm irregular brown macule noted on the left abdomen. - Plan: Performed shave biopsies for both lesions.   - Follow-Up:   - Keep an eye on the healing process of the treated areas, replacing the Band-Aid as necessary.    - We will communicate the results of the procedure through MyChart. Should further intervention be required, we will reach out to you.   - Please schedule a follow-up appointment in one year, or sooner if you have any concerns or questions.  Please follow these instructions carefully and reach out if you need any further clarification or have questions.  Warm regards,  Dr. Langston Reusing, MD Dermatology     Patient Handout: Wound Care for Skin Biopsy Site  Patient Handout: Wound Care for Skin Biopsy Site  Taking Care of Your Skin Biopsy Site  Proper care of the biopsy site is essential for promoting healing and minimizing scarring. This handout provides instructions on how to care for your biopsy site to ensure optimal recovery.  1. Cleaning the Wound:  Clean the biopsy site daily with gentle soap and water. Gently pat the area dry with a clean, soft towel. Avoid harsh scrubbing or rubbing the area, as this can irritate the skin and delay  healing.  2. Applying Aquaphor and Bandage:  After cleaning the wound, apply a thin layer of Aquaphor ointment to the biopsy site. Cover the area with a sterile bandage to protect it from dirt, bacteria, and friction. Change the bandage daily or as needed if it becomes soiled or wet.  3. Continued Care for One Week:  Repeat the cleaning, Aquaphor application, and bandaging process daily for one week following the biopsy procedure. Keeping the wound clean and moist during this initial healing period will help prevent infection and promote optimal healing.  4. Massaging Aquaphor into the Area:  ---After one week, discontinue the use of bandages but continue to apply Aquaphor to the biopsy site. ----Gently massage the Aquaphor into the area using circular motions. ---Massaging the skin helps to promote circulation and prevent the formation of scar tissue.   Additional Tips:  Avoid exposing the biopsy site to direct sunlight during the healing process, as this can cause hyperpigmentation or worsen scarring. If you experience any signs of infection, such as increased redness, swelling, warmth, or drainage from the wound, contact your healthcare provider immediately. Follow any additional instructions provided by your healthcare provider for caring for the biopsy site and managing any discomfort. Conclusion:  Taking proper care of your skin biopsy site is crucial for ensuring optimal healing and minimizing scarring. By following these instructions for cleaning, applying Aquaphor, and massaging the area, you can promote a smooth and successful recovery. If you have any questions or concerns about caring for  your biopsy site, don't hesitate to contact your healthcare provider for guidance.    Cryotherapy Aftercare  Wash gently with soap and water everyday.   Apply Vaseline and Band-Aid daily until healed.    Important Information  Due to recent changes in healthcare laws, you may see  results of your pathology and/or laboratory studies on MyChart before the doctors have had a chance to review them. We understand that in some cases there may be results that are confusing or concerning to you. Please understand that not all results are received at the same time and often the doctors may need to interpret multiple results in order to provide you with the best plan of care or course of treatment. Therefore, we ask that you please give [place] 2 business days to thoroughly review all your results before contacting the office for clarification. Should we see a critical lab result, you will be contacted sooner.   If You Need Anything After Your Visit  If you have any questions or concerns for your doctor, please call our main line at [phone] If no one answers, please leave a voicemail as directed and we will return your call as soon as possible. Messages left after 4 pm will be answered the following business day.   You may also send [place] a message via MyChart. We typically respond to MyChart messages within 1-2 business days.  For prescription refills, please ask your pharmacy to contact our office. Our fax number is [phone].  If you have an urgent issue when the clinic is closed that cannot wait until the next business day, you can page your doctor at the number below.    Please note that while we do our best to be available for urgent issues outside of office hours, we are not available 24/7.   If you have an urgent issue and are unable to reach [place], you may choose to seek medical care at your doctor's office, retail clinic, urgent care center, or emergency room.  If you have a medical emergency, please immediately call 911 or go to the emergency department. In the event of inclement weather, please call our main line at [phone] for an update on the status of any delays or closures.  Dermatology Medication Tips: Please keep the boxes that topical medications come in in  order to help keep track of the instructions about where and how to use these. Pharmacies typically print the medication instructions only on the boxes and not directly on the medication tubes.   If your medication is too expensive, please contact our office at [phone] or send [place] a message through MyChart.   We are unable to tell what your co-pay for medications will be in advance as this is different depending on your insurance coverage. However, we may be able to find a substitute medication at lower cost or fill out paperwork to get insurance to cover a needed medication.   If a prior authorization is required to get your medication covered by your insurance company, please allow [place] 1-2 business days to complete this process.  Drug prices often vary depending on where the prescription is filled and some pharmacies may offer cheaper prices.  The website www.goodrx.com contains coupons for medications through different pharmacies. The prices here do not account for what the cost may be with help from insurance (it may be cheaper with your insurance), but the website can give you the price if you did not use any insurance.  -  You can print the associated coupon and take it with your prescription to the pharmacy.  - You may also stop by our office during regular business hours and pick up a GoodRx coupon card.  - If you need your prescription sent electronically to a different pharmacy, notify our office through [facility] or by phone at [phone]

## 2023-03-05 NOTE — Progress Notes (Signed)
New Patient Visit   Subjective  Alejandro Ayers is a 52 y.o. male who presents for the following: Skin Cancer Screening and Full Body Skin Exam  The patient presents for Total-Body Skin Exam (TBSE) for skin cancer screening and mole check. The patient has spots, moles and lesions to be evaluated, some may be new or changing and the patient may have concern these could be cancer. Pt has had precancers in the past but no hx of skin cancer. Pt has family hx of skin cancer. Pt had his last skin check around 1 yr ago. He wears sunscreen daily.    The following portions of the chart were reviewed this encounter and updated as appropriate: medications, allergies, medical history  Review of Systems:  No other skin or systemic complaints except as noted in HPI or Assessment and Plan.  Objective  Well appearing patient in no apparent distress; mood and affect are within normal limits.  A full examination was performed including scalp, head, eyes, ears, nose, lips, neck, chest, axillae, abdomen, back, buttocks, bilateral upper extremities, bilateral lower extremities, hands, feet, fingers, toes, fingernails, and toenails. All findings within normal limits unless otherwise noted below.   Relevant physical exam findings are noted in the Assessment and Plan.    Assessment & Plan   SKIN CANCER SCREENING PERFORMED TODAY.  ACTINIC DAMAGE chest and arms - Chronic condition, secondary to cumulative UV/sun exposure - diffuse scaly erythematous macules with underlying dyspigmentation - Recommend daily broad spectrum sunscreen SPF 30+ to sun-exposed areas, reapply every 2 hours as needed.  - Staying in the shade or wearing long sleeves, sun glasses (UVA+UVB protection) and wide brim hats (4-inch brim around the entire circumference of the hat) are also recommended for sun protection.  - Call for new or changing lesions.  LENTIGINES Exam: scattered tan macules Due to sun exposure Treatment  Plan: Benign-appearing, observe. Recommend daily broad spectrum sunscreen SPF 30+ to sun-exposed areas, reapply every 2 hours as needed.  Call for any changes   MELANOCYTIC NEVI - Tan-brown and/or pink-flesh-colored symmetric macules and papules - Benign appearing on exam today - Observation - Call clinic for new or changing moles - Recommend daily use of broad spectrum spf 30+ sunscreen to sun-exposed areas.   SEBORRHEIC KERATOSIS - Stuck-on, waxy, tan-brown papules and/or plaques  - Benign-appearing - Discussed benign etiology and prognosis. - Observe - Call for any changes    Assessment & Plan   AK (actinic keratosis) (2) vertex scalp, left ear  Related Procedures Destruction of lesion Complexity: simple   Destruction method: cryotherapy   Informed consent: discussed and consent obtained   Timeout:  patient name, date of birth, surgical site, and procedure verified Lesion destroyed using liquid nitrogen: Yes   Post-procedure details: wound care instructions given     Assessment & Plan   AK (actinic keratosis) (2) vertex scalp, left ear  Related Procedures Destruction of lesion Complexity: simple   Destruction method: cryotherapy   Informed consent: discussed and consent obtained   Timeout:  patient name, date of birth, surgical site, and procedure verified Lesion destroyed using liquid nitrogen: Yes   Post-procedure details: wound care instructions given    Neoplasm of uncertain behavior of skin (2) mid abdomane  Skin / nail biopsy Type of biopsy: tangential   Informed consent: discussed and consent obtained   Timeout: patient name, date of birth, surgical site, and procedure verified   Procedure prep:  Patient was prepped and draped in usual sterile  fashion Prep type:  Isopropyl alcohol Anesthesia: the lesion was anesthetized in a standard fashion   Anesthetic:  1% lidocaine w/ epinephrine 1-100,000 buffered w/ 8.4% NaHCO3 Instrument used: DermaBlade    Hemostasis achieved with: aluminum chloride   Outcome: patient tolerated procedure well   Post-procedure details: sterile dressing applied and wound care instructions given   Dressing type: petrolatum gauze and bandage    Left Abdomen (side) - Upper  Skin / nail biopsy Type of biopsy: tangential   Informed consent: discussed and consent obtained   Timeout: patient name, date of birth, surgical site, and procedure verified   Procedure prep:  Patient was prepped and draped in usual sterile fashion Prep type:  Isopropyl alcohol Anesthesia: the lesion was anesthetized in a standard fashion   Anesthetic:  1% lidocaine w/ epinephrine 1-100,000 buffered w/ 8.4% NaHCO3 Instrument used: DermaBlade   Hemostasis achieved with: aluminum chloride   Outcome: patient tolerated procedure well   Post-procedure details: sterile dressing applied and wound care instructions given   Dressing type: petrolatum gauze and bandage     -Pt advised to use plenty of vaseline and a bandage on the treated spots -Advised to continue wearing UV swimwear and sunscreen, reapplying every 3 hours -Pt advised to do self checks on a regular basis and to call if any mole changes or looks out of place. He was told that we will contact him with biopsy results when they become available        No follow-ups on file.  Owens Shark, CMA, am acting as scribe for [employer], DO.   Documentation: I have reviewed the above documentation for accuracy and completeness, and I agree with the above.  Langston Reusing, DO

## 2023-03-11 NOTE — Progress Notes (Signed)
Hi Alejandro Ayers  Dr. Onalee Hua reviewed your biopsy results and they showed the spot removed was a little "abnormal" but not cancerous.  No additional treatment is required.  We will continue to monitor the area for re-pigmentation during your annual skin exams. The detailed report is available to view in MyChart.  Have a great day!  Kind Regards,  Dr. Kermit Balo Care Team

## 2023-04-10 ENCOUNTER — Other Ambulatory Visit (HOSPITAL_BASED_OUTPATIENT_CLINIC_OR_DEPARTMENT_OTHER): Payer: Self-pay

## 2023-04-10 MED ORDER — AMOXICILLIN 500 MG PO CAPS
500.0000 mg | ORAL_CAPSULE | Freq: Three times a day (TID) | ORAL | 0 refills | Status: DC
  Filled 2023-04-10: qty 15, 5d supply, fill #0

## 2023-04-23 ENCOUNTER — Ambulatory Visit: Payer: Commercial Managed Care - PPO | Admitting: Family Medicine

## 2023-04-23 ENCOUNTER — Encounter: Payer: Self-pay | Admitting: Family Medicine

## 2023-04-23 VITALS — BP 122/70 | HR 96 | Temp 98.5°F | Wt 197.0 lb

## 2023-04-23 DIAGNOSIS — I10 Essential (primary) hypertension: Secondary | ICD-10-CM

## 2023-04-23 DIAGNOSIS — E785 Hyperlipidemia, unspecified: Secondary | ICD-10-CM

## 2023-04-23 DIAGNOSIS — E663 Overweight: Secondary | ICD-10-CM

## 2023-04-23 NOTE — Patient Instructions (Addendum)
Schedule your complete physical in 6 months We'll notify you of your lab results and make any changes if needed Keep up the good work on healthy diet and regular exercise- you're doing great!!! Call with any questions or concerns Stay Safe!  Stay Healthy! Happy Fall!!! 

## 2023-04-23 NOTE — Progress Notes (Signed)
   Subjective:    Patient ID: Alejandro Ayers, male    DOB: 1971-05-06, 52 y.o.   MRN: [id number]  HPI HTN- chronic problem, on Lisinopril hydrochlorothiazide 20/12.5mg  daily.  Pt reports feeling good.  No CP, SOB, HA's, visual changes, edema.  Hyperlipidemia- chronic problem, on Lipitor 20mg  daily.  No abd pain, N/V.  Overweight- pt has gained 5 lbs since last visit.  Walking regularly.   Review of Systems For ROS see HPI     Objective:   Physical Exam Vitals reviewed.  Constitutional:      General: He is not in acute distress.    Appearance: Normal appearance. He is well-developed. He is not ill-appearing.  HENT:     Head: Normocephalic and atraumatic.  Eyes:     Extraocular Movements: Extraocular movements intact.     Conjunctiva/sclera: Conjunctivae normal.     Pupils: Pupils are equal, round, and reactive to light.  Neck:     Thyroid: No thyromegaly.  Cardiovascular:     Rate and Rhythm: Normal rate and regular rhythm.     Pulses: Normal pulses.     Heart sounds: Normal heart sounds. No murmur heard. Pulmonary:     Effort: Pulmonary effort is normal. No respiratory distress.     Breath sounds: Normal breath sounds.  Abdominal:     General: Bowel sounds are normal. There is no distension.     Palpations: Abdomen is soft.  Musculoskeletal:     Cervical back: Normal range of motion and neck supple.     Right lower leg: No edema.     Left lower leg: No edema.  Lymphadenopathy:     Cervical: No cervical adenopathy.  Skin:    General: Skin is warm and dry.  Neurological:     General: No focal deficit present.     Mental Status: He is alert and oriented to person, place, and time.     Cranial Nerves: No cranial nerve deficit.  Psychiatric:        Mood and Affect: Mood normal.        Behavior: Behavior normal.           Assessment & Plan:

## 2023-04-23 NOTE — Assessment & Plan Note (Signed)
Chronic problem.  On Lipitor 20mg daily w/o difficulty.  Check labs.  Adjust meds prn  

## 2023-04-23 NOTE — Assessment & Plan Note (Signed)
Chronic problem.  Currently well controlled on lisinopril hydrochlorothiazide 20/12.5mg  daily.  Asymptomatic.  Check labs as pt is on ACE and diuretic but no anticipated med changes.  Will follow.

## 2023-04-23 NOTE — Assessment & Plan Note (Signed)
Pt has gained 5 lbs.  He is walking regularly.  Given his frame size, weight seems to be appropriate.  Will follow.

## 2023-04-24 LAB — BASIC METABOLIC PANEL
BUN: 19 mg/dL (ref 6–23)
CO2: 28 meq/L (ref 19–32)
Calcium: 9.4 mg/dL (ref 8.4–10.5)
Chloride: 101 meq/L (ref 96–112)
Creatinine, Ser: 0.96 mg/dL (ref 0.40–1.50)
GFR: 89.55 mL/min (ref >60.00)
Glucose, Bld: 88 mg/dL (ref 70–99)
Potassium: 4.4 meq/L (ref 3.5–5.1)
Sodium: 138 meq/L (ref 135–145)

## 2023-04-24 LAB — LIPID PANEL
Cholesterol: 164 mg/dL (ref 0–200)
HDL: 34.8 mg/dL — ABNORMAL LOW (ref >39.00)
LDL Cholesterol: 67 mg/dL (ref 0–99)
NonHDL: 129.24
Total CHOL/HDL Ratio: 5
Triglycerides: 311 mg/dL — ABNORMAL HIGH (ref 0.0–149.0)
VLDL: 62.2 mg/dL — ABNORMAL HIGH (ref 0.0–40.0)

## 2023-04-24 LAB — HEPATIC FUNCTION PANEL
ALT: 21 U/L (ref 0–53)
AST: 18 U/L (ref 0–37)
Albumin: 4.3 g/dL (ref 3.5–5.2)
Alkaline Phosphatase: 90 U/L (ref 39–117)
Bilirubin, Direct: 0.1 mg/dL (ref 0.0–0.3)
Total Bilirubin: 0.3 mg/dL (ref 0.2–1.2)
Total Protein: 6.8 g/dL (ref 6.0–8.3)

## 2023-04-24 LAB — CBC WITH DIFFERENTIAL/PLATELET
Basophils Absolute: 0.1 10*3/uL (ref 0.0–0.1)
Basophils Relative: 1.1 % (ref 0.0–3.0)
Eosinophils Absolute: 0.3 10*3/uL (ref 0.0–0.7)
Eosinophils Relative: 2.7 % (ref 0.0–5.0)
HCT: 42.4 % (ref 39.0–52.0)
Hemoglobin: 14 g/dL (ref 13.0–17.0)
Lymphocytes Relative: 27.1 % (ref 12.0–46.0)
Lymphs Abs: 2.6 10*3/uL (ref 0.7–4.0)
MCHC: 33 g/dL (ref 30.0–36.0)
MCV: 93.8 fL (ref 78.0–100.0)
Monocytes Absolute: 1 10*3/uL (ref 0.1–1.0)
Monocytes Relative: 9.8 % (ref 3.0–12.0)
Neutro Abs: 5.8 10*3/uL (ref 1.4–7.7)
Neutrophils Relative %: 59.3 % (ref 43.0–77.0)
Platelets: 386 10*3/uL (ref 150.0–400.0)
RBC: 4.52 Mil/uL (ref 4.22–5.81)
RDW: 14.5 % (ref 11.5–15.5)
WBC: 9.7 10*3/uL (ref 4.0–10.5)

## 2023-04-24 LAB — TSH: TSH: 1.62 u[IU]/mL (ref 0.35–5.50)

## 2023-07-31 ENCOUNTER — Ambulatory Visit: Payer: Commercial Managed Care - PPO | Admitting: Podiatry

## 2023-07-31 ENCOUNTER — Encounter: Payer: Self-pay | Admitting: Podiatry

## 2023-07-31 DIAGNOSIS — Q6671 Congenital pes cavus, right foot: Secondary | ICD-10-CM | POA: Diagnosis not present

## 2023-07-31 DIAGNOSIS — Q667 Congenital pes cavus, unspecified foot: Secondary | ICD-10-CM

## 2023-07-31 DIAGNOSIS — Q6672 Congenital pes cavus, left foot: Secondary | ICD-10-CM | POA: Diagnosis not present

## 2023-07-31 NOTE — Progress Notes (Signed)
  Subjective:  Patient ID: Alejandro Ayers, male    DOB: [birth year],  MRN: [id number]  Chief Complaint  Patient presents with   Foot Pain    Patient states his arches of bilateral feel feels like sharp pains , he is on his feet all day long , this has been going on for a couple years . Patient says he has got shots in his arches due to plantar fasciitis and it work for a few years . He does not think this is plantar fasciitis . No medication for pain     52 y.o. male presents with the above complaint.  Patient presents with bilateral high arch foot structure.  Patient states getting arch pain.  He is on his feet all day for couple years.  He is to get shots in his arches for plantar fasciitis.  It is not the same pain.  He wanted get it evaluated he does not wear any orthotics.  He does not take any medication.   Review of Systems: Negative except as noted in the HPI. Denies N/V/F/Ch.  Past Medical History:  Diagnosis Date   Eczema    Hyperlipidemia    Hypertension    Tennis elbow    Had a cortisone injection in left elbow in the beginning of 2019    Current Outpatient Medications:    atorvastatin  (LIPITOR) 20 MG tablet, TAKE 1 TABLET DAILY, Disp: 90 tablet, Rfl: 1   lisinopril -hydrochlorothiazide  (ZESTORETIC ) 20-12.5 MG tablet, TAKE 1 TABLET DAILY, Disp: 90 tablet, Rfl: 1   MULTIPLE VITAMIN PO, Take 1 tablet by mouth daily., Disp: , Rfl:   Social History   Tobacco Use  Smoking Status Former   Current packs/day: 0.00   Types: Cigarettes   Quit date: 09/13/2018   Years since quitting: 4.8  Smokeless Tobacco Never    No Known Allergies Objective:  There were no vitals filed for this visit. There is no height or weight on file to calculate BMI. Constitutional Well developed. Well nourished.  Vascular Dorsalis pedis pulses palpable bilaterally. Posterior tibial pulses palpable bilaterally. Capillary refill normal to all digits.  No cyanosis or clubbing noted. Pedal hair  growth normal.  Neurologic Normal speech. Oriented to person, place, and time. Epicritic sensation to light touch grossly present bilaterally.  Dermatologic Nails well groomed and normal in appearance. No open wounds. No skin lesions.  Orthopedic: Generalized pain noted in the arch of bilateral feet.  High arch foot structure noted.  No plantar fasciitis pain noted   Radiographs: None Assessment:   1. Pes cavus    Plan:  Patient was evaluated and treated and all questions answered.  Pes cavus -I explained to patient the etiology of pes pes cavus and relationship with Planter fasciitis and various treatment options were discussed.  Given patient foot structure in the setting of Planter fasciitis I believe patient will benefit from custom-made orthotics to help control the hindfoot motion support the arch of the foot and take the stress away from plantar fascial.  Patient agrees with the plan like to proceed with orthotics -Patient was casted for orthotics   No follow-ups on file.

## 2023-08-12 NOTE — Progress Notes (Signed)
Orthotic order placed when items are in patient will be called for fitting appt  Addison Bailey Cped, CFo, CFm

## 2023-08-28 ENCOUNTER — Telehealth: Payer: Self-pay

## 2023-08-28 NOTE — Telephone Encounter (Signed)
 Left VM to schedule orthotic pick up

## 2023-10-16 ENCOUNTER — Ambulatory Visit: Payer: Commercial Managed Care - PPO

## 2023-10-16 NOTE — Progress Notes (Signed)
 Patient presents today to pick up custom molded foot orthotics, diagnosed with Pes Cavus / PF by Dr. Allena Katz.   Orthotics were dispensed and fit was satisfactory. Reviewed instructions for break-in and wear. Written instructions given to patient.  Patient will follow up as needed.  Alejandro Ayers CPed, CFo, CFm

## 2023-10-23 ENCOUNTER — Encounter: Payer: Self-pay | Admitting: Family Medicine

## 2023-10-23 ENCOUNTER — Ambulatory Visit: Payer: Commercial Managed Care - PPO | Admitting: Family Medicine

## 2023-10-23 VITALS — BP 126/78 | HR 79 | Temp 98.3°F | Ht 73.0 in | Wt 193.0 lb

## 2023-10-23 DIAGNOSIS — I10 Essential (primary) hypertension: Secondary | ICD-10-CM | POA: Diagnosis not present

## 2023-10-23 DIAGNOSIS — Z0000 Encounter for general adult medical examination without abnormal findings: Secondary | ICD-10-CM

## 2023-10-23 DIAGNOSIS — Z125 Encounter for screening for malignant neoplasm of prostate: Secondary | ICD-10-CM

## 2023-10-23 LAB — LIPID PANEL
Cholesterol: 158 mg/dL (ref 0–200)
HDL: 35.9 mg/dL — ABNORMAL LOW (ref >39.00)
LDL Cholesterol: 106 mg/dL — ABNORMAL HIGH (ref 0–99)
NonHDL: 122.12
Total CHOL/HDL Ratio: 4
Triglycerides: 79 mg/dL (ref 0.0–149.0)
VLDL: 15.8 mg/dL (ref 0.0–40.0)

## 2023-10-23 LAB — BASIC METABOLIC PANEL WITH GFR
BUN: 21 mg/dL (ref 6–23)
CO2: 28 meq/L (ref 19–32)
Calcium: 9.3 mg/dL (ref 8.4–10.5)
Chloride: 102 meq/L (ref 96–112)
Creatinine, Ser: 0.92 mg/dL (ref 0.40–1.50)
GFR: 93.91 mL/min (ref >60.00)
Glucose, Bld: 84 mg/dL (ref 70–99)
Potassium: 4.7 meq/L (ref 3.5–5.1)
Sodium: 139 meq/L (ref 135–145)

## 2023-10-23 LAB — CBC WITH DIFFERENTIAL/PLATELET
Basophils Absolute: 0 10*3/uL (ref 0.0–0.1)
Basophils Relative: 0.6 % (ref 0.0–3.0)
Eosinophils Absolute: 0.3 10*3/uL (ref 0.0–0.7)
Eosinophils Relative: 3.4 % (ref 0.0–5.0)
HCT: 44.9 % (ref 39.0–52.0)
Hemoglobin: 15.1 g/dL (ref 13.0–17.0)
Lymphocytes Relative: 26.5 % (ref 12.0–46.0)
Lymphs Abs: 2.1 10*3/uL (ref 0.7–4.0)
MCHC: 33.5 g/dL (ref 30.0–36.0)
MCV: 93.3 fl (ref 78.0–100.0)
Monocytes Absolute: 0.9 10*3/uL (ref 0.1–1.0)
Monocytes Relative: 10.8 % (ref 3.0–12.0)
Neutro Abs: 4.7 10*3/uL (ref 1.4–7.7)
Neutrophils Relative %: 58.7 % (ref 43.0–77.0)
Platelets: 321 10*3/uL (ref 150.0–400.0)
RBC: 4.81 Mil/uL (ref 4.22–5.81)
RDW: 15 % (ref 11.5–15.5)
WBC: 7.9 10*3/uL (ref 4.0–10.5)

## 2023-10-23 LAB — HEPATIC FUNCTION PANEL
ALT: 19 U/L (ref 0–53)
AST: 16 U/L (ref 0–37)
Albumin: 4.4 g/dL (ref 3.5–5.2)
Alkaline Phosphatase: 80 U/L (ref 39–117)
Bilirubin, Direct: 0.1 mg/dL (ref 0.0–0.3)
Total Bilirubin: 0.5 mg/dL (ref 0.2–1.2)
Total Protein: 6.7 g/dL (ref 6.0–8.3)

## 2023-10-23 LAB — PSA: PSA: 1.46 ng/mL (ref 0.10–4.00)

## 2023-10-23 LAB — TSH: TSH: 1.02 u[IU]/mL (ref 0.35–5.50)

## 2023-10-23 NOTE — Telephone Encounter (Signed)
-----   Message from Neena Rhymes sent at 10/23/2023  2:51 PM EDT ----- Labs look great!  No changes at this time

## 2023-10-23 NOTE — Progress Notes (Signed)
   Subjective:    Patient ID: Alejandro Ayers, male    DOB: [birth year], 53 y.o.   MRN: [id number]  HPI CPE- UTD on Tdap, colonoscopy (appt scheduled)  Patient Care Team    Relationship Specialty Notifications Start End  Sheliah Hatch, MD PCP - General Family Medicine  12/31/15      Health Maintenance  Topic Date Due   COVID-19 Vaccine (3 - Moderna risk series) 11/23/2019   Colonoscopy  08/02/2023   INFLUENZA VACCINE  02/19/2024   DTaP/Tdap/Td (3 - Td or Tdap) 12/31/2031   HIV Screening  Completed   Zoster Vaccines- Shingrix  Completed   Pneumococcal Vaccine 82-34 Years old  Aged Out   HPV VACCINES  Aged Out   Hepatitis C Screening  Discontinued      Review of Systems Patient reports no vision/hearing changes, anorexia, fever ,adenopathy, persistant/recurrent hoarseness, swallowing issues, chest pain, palpitations, edema, persistant/recurrent cough, hemoptysis, dyspnea (rest,exertional, paroxysmal nocturnal), gastrointestinal  bleeding (melena, rectal bleeding), abdominal pain, excessive heart burn, GU symptoms (dysuria, hematuria, voiding/incontinence issues) syncope, focal weakness, memory loss, numbness & tingling, skin/hair/nail changes, depression, anxiety, abnormal bruising/bleeding, musculoskeletal symptoms/signs.     Objective:   Physical Exam General Appearance:    Alert, cooperative, no distress, appears stated age  Head:    Normocephalic, without obvious abnormality, atraumatic  Eyes:    PERRL, conjunctiva/corneas clear, EOM's intact both eyes       Ears:    Normal TM's and external ear canals, both ears  Nose:   Nares normal, septum midline, mucosa normal, no drainage   or sinus tenderness  Throat:   Lips, mucosa, and tongue normal; teeth and gums normal  Neck:   Supple, symmetrical, trachea midline, no adenopathy;       thyroid:  No enlargement/tenderness/nodules  Back:     Symmetric, no curvature, ROM normal, no CVA tenderness  Lungs:     Clear to  auscultation bilaterally, respirations unlabored  Chest wall:    No tenderness or deformity  Heart:    Regular rate and rhythm, S1 and S2 normal, no murmur, rub   or gallop  Abdomen:     Soft, non-tender, bowel sounds active all four quadrants,    no masses, no organomegaly  Genitalia:    deferred  Rectal:    Extremities:   Extremities normal, atraumatic, no cyanosis or edema  Pulses:   2+ and symmetric all extremities  Skin:   Skin color, texture, turgor normal, no rashes or lesions  Lymph nodes:   Cervical, supraclavicular, and axillary nodes normal  Neurologic:   CNII-XII intact. Normal strength, sensation and reflexes      throughout          Assessment & Plan:

## 2023-10-23 NOTE — Assessment & Plan Note (Signed)
 Pt's PE WNL.  UTD on immunizations, colonoscopy scheduled.  Check labs.  Anticipatory guidance provided.

## 2023-10-23 NOTE — Patient Instructions (Signed)
Follow up in 6 months to recheck BP and cholesterol We'll notify you of your lab results and make any changes if needed Keep up the good work on healthy diet and regular exercise- you look great! Call with any questions or concerns Stay Safe!  Stay Healthy! Happy Spring! 

## 2023-10-23 NOTE — Telephone Encounter (Signed)
 Lab results have been discussed.   Verbalized understanding? Yes  Are there any questions? No

## 2023-10-26 ENCOUNTER — Telehealth: Payer: Self-pay

## 2023-10-27 NOTE — Telephone Encounter (Signed)
 Placed in folder at nurse station

## 2023-10-27 NOTE — Telephone Encounter (Signed)
 Form completed and returned to [place]

## 2023-10-27 NOTE — Telephone Encounter (Signed)
error 

## 2023-10-27 NOTE — Telephone Encounter (Signed)
 Faxed

## 2023-11-18 ENCOUNTER — Ambulatory Visit

## 2023-11-20 ENCOUNTER — Ambulatory Visit

## 2023-12-15 ENCOUNTER — Other Ambulatory Visit: Payer: Self-pay | Admitting: Family Medicine

## 2024-03-08 ENCOUNTER — Encounter: Payer: Self-pay | Admitting: Dermatology

## 2024-03-08 ENCOUNTER — Ambulatory Visit: Payer: Commercial Managed Care - PPO | Admitting: Dermatology

## 2024-03-08 VITALS — BP 127/73 | HR 88

## 2024-03-08 DIAGNOSIS — Z1283 Encounter for screening for malignant neoplasm of skin: Secondary | ICD-10-CM

## 2024-03-08 DIAGNOSIS — L814 Other melanin hyperpigmentation: Secondary | ICD-10-CM | POA: Diagnosis not present

## 2024-03-08 DIAGNOSIS — W908XXA Exposure to other nonionizing radiation, initial encounter: Secondary | ICD-10-CM

## 2024-03-08 DIAGNOSIS — D1801 Hemangioma of skin and subcutaneous tissue: Secondary | ICD-10-CM

## 2024-03-08 DIAGNOSIS — L821 Other seborrheic keratosis: Secondary | ICD-10-CM

## 2024-03-08 DIAGNOSIS — D229 Melanocytic nevi, unspecified: Secondary | ICD-10-CM

## 2024-03-08 DIAGNOSIS — D485 Neoplasm of uncertain behavior of skin: Secondary | ICD-10-CM

## 2024-03-08 DIAGNOSIS — C4442 Squamous cell carcinoma of skin of scalp and neck: Secondary | ICD-10-CM

## 2024-03-08 NOTE — Progress Notes (Signed)
   Total Body Skin Exam (TBSE) Visit   Subjective  Alejandro Ayers is a 53 y.o. male who presents for the following: Skin Cancer Screening and Full Body Skin Exam  Patient presents today for follow up visit for TBSE. Patient was last evaluated on 03/05/23 . Patient denies medication changes. Patient reports he  does not have spots, moles and lesions of concern to be evaluated. Patient reports throughout his lifetime he  has had moderate sun exposure. Currently, patient reports if he  has excessive sun exposure, he  does apply sunscreen and/or wears protective coverings. Patient reports he  has hx of bx. Patient reports  family history of skin cancers. The patient has spots, moles and lesions to be evaluated, some may be new or changing and the patient has concerns that these could be cancer.  The following portions of the chart were reviewed this encounter and updated as appropriate: medications, allergies, medical history  Review of Systems:  No other skin or systemic complaints except as noted in HPI or Assessment and Plan.  Objective  Well appearing patient in no apparent distress; mood and affect are within normal limits.  A full examination was performed including scalp, head, eyes, ears, nose, lips, neck, chest, axillae, abdomen, back, buttocks, bilateral upper extremities, bilateral lower extremities, hands, feet, fingers, toes, fingernails, and toenails. All findings within normal limits unless otherwise noted below.   Relevant physical exam findings are noted in the Assessment and Plan.  Mid Parietal Scalp 0.6 cm hyperkeratotic papule   Assessment & Plan   LENTIGINES, SEBORRHEIC KERATOSES, HEMANGIOMAS - Benign normal skin lesions - Benign-appearing - Call for any changes  MELANOCYTIC NEVI - Tan-brown and/or pink-flesh-colored symmetric macules and papules - Benign appearing on exam today - Observation - Call clinic for new or changing moles - Recommend daily use of broad  spectrum spf 30+ sunscreen to sun-exposed areas.   ACTINIC DAMAGE - Chronic condition, secondary to cumulative UV/sun exposure - diffuse scaly erythematous macules with underlying dyspigmentation - Recommend daily broad spectrum sunscreen SPF 30+ to sun-exposed areas, reapply every 2 hours as needed.  - Staying in the shade or wearing long sleeves, sun glasses (UVA+UVB protection) and wide brim hats (4-inch brim around the entire circumference of the hat) are also recommended for sun protection.  - Call for new or changing lesions.  SKIN CANCER SCREENING PERFORMED TODAY.    NEOPLASM OF UNCERTAIN BEHAVIOR OF SKIN Mid Parietal Scalp Skin / nail biopsy Type of biopsy: tangential   Informed consent: discussed and consent obtained   Timeout: patient name, date of birth, surgical site, and procedure verified   Procedure prep:  Patient was prepped and draped in usual sterile fashion Prep type:  Isopropyl alcohol Anesthesia: the lesion was anesthetized in a standard fashion   Anesthetic:  1% lidocaine  w/ epinephrine 1-100,000 buffered w/ 8.4% NaHCO3 Instrument used: flexible razor blade   Hemostasis achieved with: pressure, aluminum chloride and electrodesiccation   Outcome: patient tolerated procedure well   Post-procedure details: sterile dressing applied and wound care instructions given   Dressing type: bandage and petrolatum    Specimen 1 - Surgical pathology Differential Diagnosis: R/O SCC  Check Margins: No Return in about 1 year (around 03/08/2025) for TBSE.  I, Jetta Ager, am acting as Neurosurgeon for [employer], DO.  Documentation: I have reviewed the above documentation for accuracy and completeness, and I agree with the above.  Delon Lenis, DO

## 2024-03-08 NOTE — Patient Instructions (Signed)

## 2024-03-09 LAB — SURGICAL PATHOLOGY

## 2024-03-17 ENCOUNTER — Ambulatory Visit: Payer: Self-pay | Admitting: Dermatology

## 2024-03-17 ENCOUNTER — Encounter: Payer: Self-pay | Admitting: Dermatology

## 2024-03-17 DIAGNOSIS — D099 Carcinoma in situ, unspecified: Secondary | ICD-10-CM | POA: Insufficient documentation

## 2024-04-22 ENCOUNTER — Ambulatory Visit: Admitting: Family Medicine

## 2024-04-22 ENCOUNTER — Encounter: Payer: Self-pay | Admitting: Family Medicine

## 2024-04-22 VITALS — BP 130/68 | HR 77 | Temp 98.1°F | Wt 196.0 lb

## 2024-04-22 DIAGNOSIS — Z23 Encounter for immunization: Secondary | ICD-10-CM | POA: Diagnosis not present

## 2024-04-22 DIAGNOSIS — I10 Essential (primary) hypertension: Secondary | ICD-10-CM

## 2024-04-22 DIAGNOSIS — E663 Overweight: Secondary | ICD-10-CM

## 2024-04-22 DIAGNOSIS — E785 Hyperlipidemia, unspecified: Secondary | ICD-10-CM | POA: Diagnosis not present

## 2024-04-22 LAB — BASIC METABOLIC PANEL WITH GFR
BUN: 21 mg/dL (ref 6–23)
CO2: 29 meq/L (ref 19–32)
Calcium: 9.7 mg/dL (ref 8.4–10.5)
Chloride: 100 meq/L (ref 96–112)
Creatinine, Ser: 0.93 mg/dL (ref 0.40–1.50)
GFR: 92.38 mL/min (ref >60.00)
Glucose, Bld: 79 mg/dL (ref 70–99)
Potassium: 4.6 meq/L (ref 3.5–5.1)
Sodium: 136 meq/L (ref 135–145)

## 2024-04-22 LAB — LIPID PANEL
Cholesterol: 152 mg/dL (ref 0–200)
HDL: 37.2 mg/dL — ABNORMAL LOW (ref >39.00)
LDL Cholesterol: 95 mg/dL (ref 0–99)
NonHDL: 115.12
Total CHOL/HDL Ratio: 4
Triglycerides: 101 mg/dL (ref 0.0–149.0)
VLDL: 20.2 mg/dL (ref 0.0–40.0)

## 2024-04-22 LAB — CBC WITH DIFFERENTIAL/PLATELET
Basophils Absolute: 0.1 K/uL (ref 0.0–0.1)
Basophils Relative: 0.8 % (ref 0.0–3.0)
Eosinophils Absolute: 0.1 K/uL (ref 0.0–0.7)
Eosinophils Relative: 2 % (ref 0.0–5.0)
HCT: 46 % (ref 39.0–52.0)
Hemoglobin: 15.3 g/dL (ref 13.0–17.0)
Lymphocytes Relative: 26.9 % (ref 12.0–46.0)
Lymphs Abs: 2 K/uL (ref 0.7–4.0)
MCHC: 33.3 g/dL (ref 30.0–36.0)
MCV: 93.2 fl (ref 78.0–100.0)
Monocytes Absolute: 0.7 K/uL (ref 0.1–1.0)
Monocytes Relative: 10 % (ref 3.0–12.0)
Neutro Abs: 4.5 K/uL (ref 1.4–7.7)
Neutrophils Relative %: 60.3 % (ref 43.0–77.0)
Platelets: 302 K/uL (ref 150.0–400.0)
RBC: 4.94 Mil/uL (ref 4.22–5.81)
RDW: 14.3 % (ref 11.5–15.5)
WBC: 7.4 K/uL (ref 4.0–10.5)

## 2024-04-22 LAB — HEPATIC FUNCTION PANEL
ALT: 22 U/L (ref 0–53)
AST: 23 U/L (ref 0–37)
Albumin: 4.5 g/dL (ref 3.5–5.2)
Alkaline Phosphatase: 77 U/L (ref 39–117)
Bilirubin, Direct: 0.1 mg/dL (ref 0.0–0.3)
Total Bilirubin: 0.4 mg/dL (ref 0.2–1.2)
Total Protein: 7.3 g/dL (ref 6.0–8.3)

## 2024-04-22 LAB — TSH: TSH: 1.01 u[IU]/mL (ref 0.35–5.50)

## 2024-04-22 NOTE — Assessment & Plan Note (Signed)
 Ongoing issue.  Pt is now walking regularly and has resumed horseback riding.  Applauded his efforts.  Will continue to follow.

## 2024-04-22 NOTE — Assessment & Plan Note (Signed)
Chronic problem.  On Lipitor 20mg daily w/o difficulty.  Check labs.  Adjust meds prn  

## 2024-04-22 NOTE — Assessment & Plan Note (Signed)
 Chronic problem.  Well controlled on Lisinopril  hydrochlorothiazide  daily.  Asymptomatic.  Check labs due to ACE and diuretic use but no anticipated med changes.

## 2024-04-22 NOTE — Patient Instructions (Signed)
Schedule your complete physical in 6 months We'll notify you of your lab results and make any changes if needed Keep up the good work on healthy diet and regular exercise- you look great!! Call with any questions or concerns Stay Safe!  Stay Healthy! Happy Fall!! 

## 2024-04-22 NOTE — Progress Notes (Signed)
   Subjective:    Patient ID: Alejandro Ayers, male    DOB: July 01, 1971, 53 y.o.   MRN: [id number]  HPI HTN- chronic problem, on Lisinopril  hydrochlorothiazide  20/12.5mg  daily w/ good control.  No CP, SOB, HA's, visual changes, edema.  Hyperlipidemia- chronic problem, on Lipitor 20mg  daily.  Denies abd pain, N/V.  Overweight- pt has gained 3 lbs since last visit.  BMI 25.86  continues to walk regularly and is now horseback riding   Review of Systems For ROS see HPI     Objective:   Physical Exam Vitals reviewed.  Constitutional:      General: He is not in acute distress.    Appearance: Normal appearance. He is well-developed. He is not ill-appearing.  HENT:     Head: Normocephalic and atraumatic.  Eyes:     Extraocular Movements: Extraocular movements intact.     Conjunctiva/sclera: Conjunctivae normal.     Pupils: Pupils are equal, round, and reactive to light.  Neck:     Thyroid : No thyromegaly.  Cardiovascular:     Rate and Rhythm: Normal rate and regular rhythm.     Pulses: Normal pulses.     Heart sounds: Normal heart sounds. No murmur heard. Pulmonary:     Effort: Pulmonary effort is normal. No respiratory distress.     Breath sounds: Normal breath sounds.  Abdominal:     General: Bowel sounds are normal. There is no distension.     Palpations: Abdomen is soft.  Musculoskeletal:     Cervical back: Normal range of motion and neck supple.     Right lower leg: No edema.     Left lower leg: No edema.  Lymphadenopathy:     Cervical: No cervical adenopathy.  Skin:    General: Skin is warm and dry.  Neurological:     General: No focal deficit present.     Mental Status: He is alert and oriented to person, place, and time.     Cranial Nerves: No cranial nerve deficit.  Psychiatric:        Mood and Affect: Mood normal.        Behavior: Behavior normal.           Assessment & Plan:

## 2024-04-25 ENCOUNTER — Ambulatory Visit: Payer: Self-pay | Admitting: Family Medicine

## 2024-04-25 NOTE — Progress Notes (Signed)
 Pt has reviewed via MyChart

## 2024-05-12 ENCOUNTER — Ambulatory Visit: Admitting: Dermatology

## 2024-05-12 ENCOUNTER — Encounter: Payer: Self-pay | Admitting: Dermatology

## 2024-05-12 VITALS — BP 145/113 | HR 102

## 2024-05-12 DIAGNOSIS — D044 Carcinoma in situ of skin of scalp and neck: Secondary | ICD-10-CM

## 2024-05-12 DIAGNOSIS — D099 Carcinoma in situ, unspecified: Secondary | ICD-10-CM

## 2024-05-12 NOTE — Progress Notes (Addendum)
   Follow-Up Visit   Subjective  Alejandro Ayers is a 53 y.o. male who presents for the following: ED&C of SCCIS  Patient was last evaluated on 03/08/2024. At this visit patient had a biposy performed on the mid parietal scalp with a diagnosis of SCCIS  The following portions of the chart were reviewed this encounter and updated as appropriate: medications, allergies, medical history  Review of Systems:  No other skin or systemic complaints except as noted in HPI or Assessment and Plan.  Objective  Well appearing patient in no apparent distress; mood and affect are within normal limits.  A focused examination was performed of the following areas: Scalp  Relevant exam findings are noted in the Assessment and Plan.  Patient presents today to have an ED&C performed on SCCIS located on Mid-Parietal scalp   Mid Parietal Scalp White strophic scar at site of prior bx  Assessment & Plan   Squamous Cell Carcinoma in situ of scalp skin Superficial skin cancer on the scalp, confirmed by biopsy, located on the vertex and slightly to the left. The area has healed well post-biopsy. - Perform curettage and electrodessication on the affected scalp area. - Instruct to apply Aquaphor to the treated area to aid healing. - Advise to wear hats when in the sun to protect the scalp. - Schedule follow-up skin check to monitor for new lesions.  SQUAMOUS CELL CARCINOMA IN SITU (SCCIS) Mid Parietal Scalp Destruction of lesion Complexity: extensive   Destruction method: electrodesiccation and curettage   Informed consent: discussed and consent obtained   Timeout:  patient name, date of birth, surgical site, and procedure verified Procedure prep:  Patient was prepped and draped in usual sterile fashion Anesthesia: the lesion was anesthetized in a standard fashion   Anesthetic:  1% lidocaine  w/ epinephrine 1-100,000 local infiltration Curettage performed in three different directions: Yes    Electrodesiccation performed over the curetted area: Yes   Curettage cycles:  3 Lesion length (cm):  0.5 Margin per side (cm):  0.3 Final wound size (cm):  1.2 Hemostasis achieved with:  electrodesiccation Outcome: patient tolerated procedure well with no complications   Post-procedure details: sterile dressing applied and wound care instructions given   Dressing type: pressure dressing     Return for next scheduled appointment.  I, Lyle Cords, as acting as a neurosurgeon for [employer], DO .   Documentation: I have reviewed the above documentation for accuracy and completeness, and I agree with the above.  Delon Lenis, DO

## 2024-05-12 NOTE — Patient Instructions (Addendum)
 Patient Handout: Wound Care for Electro-Dissection and Curettage  Taking Care of Your [facility] Site  Proper care of the [facility] site is essential for promoting healing and minimizing scarring. This handout provides instructions on how to care for your biopsy site to ensure optimal recovery.  1. Cleaning the Wound:  Clean the site daily with gentle soap and water. Gently pat the area dry with a clean, soft towel. Avoid harsh scrubbing or rubbing the area, as this can irritate the skin and delay healing.  2. Applying Aquaphor and Bandage:  After cleaning the wound, apply a thin layer of Aquaphor ointment to the site. Cover the area with a sterile bandage to protect it from dirt, bacteria, and friction. Change the bandage daily or as needed if it becomes soiled or wet.  3. Continued Care for One Week:  Repeat the cleaning, Aquaphor application, and bandaging process daily for one week following the procedure. Keeping the wound clean and moist during this initial healing period will help prevent infection and promote optimal healing.  4. Massaging Aquaphor into the Area:  ---After one week, discontinue the use of bandages but continue to apply Aquaphor to the biopsy site. ----Gently massage the Aquaphor into the area using circular motions. ---Massaging the skin helps to promote circulation and prevent the formation of scar tissue.   Additional Tips:  Avoid exposing the site to direct sunlight during the healing process, as this can cause hyperpigmentation or worsen scarring. If you experience any signs of infection, such as increased redness, swelling, warmth, or drainage from the wound, contact your healthcare provider immediately. Follow any additional instructions provided by your healthcare provider for caring for the biopsy site and managing any discomfort. Conclusion:  Taking proper care of your skin site is crucial for ensuring optimal healing and minimizing scarring. By  following these instructions for cleaning, applying Aquaphor, and massaging the area, you can promote a smooth and successful recovery. If you have any questions or concerns about caring for your biopsy site, don't hesitate to contact your healthcare provider for guidance.     Important Information  Due to recent changes in healthcare laws, you may see results of your pathology and/or laboratory studies on MyChart before the doctors have had a chance to review them. We understand that in some cases there may be results that are confusing or concerning to you. Please understand that not all results are received at the same time and often the doctors may need to interpret multiple results in order to provide you with the best plan of care or course of treatment. Therefore, we ask that you please give us  2 business days to thoroughly review all your results before contacting the office for clarification. Should we see a critical lab result, you will be contacted sooner.   If You Need Anything After Your Visit  If you have any questions or concerns for your doctor, please call our main line at [phone] If no one answers, please leave a voicemail as directed and we will return your call as soon as possible. Messages left after 4 pm will be answered the following business day.   You may also send us  a message via MyChart. We typically respond to MyChart messages within 1-2 business days.  For prescription refills, please ask your pharmacy to contact our office. Our fax number is [phone].  If you have an urgent issue when the clinic is closed that cannot wait until the next business day, you can page  your doctor at the number below.    Please note that while we do our best to be available for urgent issues outside of office hours, we are not available 24/7.   If you have an urgent issue and are unable to reach us , you may choose to seek medical care at your doctor's office, retail clinic,  urgent care center, or emergency room.  If you have a medical emergency, please immediately call 911 or go to the emergency department. In the event of inclement weather, please call our main line at [phone] for an update on the status of any delays or closures.  Dermatology Medication Tips: Please keep the boxes that topical medications come in in order to help keep track of the instructions about where and how to use these. Pharmacies typically print the medication instructions only on the boxes and not directly on the medication tubes.   If your medication is too expensive, please contact our office at [phone] or send us  a message through MyChart.   We are unable to tell what your co-pay for medications will be in advance as this is different depending on your insurance coverage. However, we may be able to find a substitute medication at lower cost or fill out paperwork to get insurance to cover a needed medication.   If a prior authorization is required to get your medication covered by your insurance company, please allow us  1-2 business days to complete this process.  Drug prices often vary depending on where the prescription is filled and some pharmacies may offer cheaper prices.  The website www.goodrx.com contains coupons for medications through different pharmacies. The prices here do not account for what the cost may be with help from insurance (it may be cheaper with your insurance), but the website can give you the price if you did not use any insurance.  - You can print the associated coupon and take it with your prescription to the pharmacy.  - You may also stop by our office during regular business hours and pick up a GoodRx coupon card.  - If you need your prescription sent electronically to a different pharmacy, notify our office through [facility] or by phone at [phone]

## 2024-05-29 ENCOUNTER — Other Ambulatory Visit: Payer: Self-pay | Admitting: Family Medicine

## 2024-08-23 ENCOUNTER — Encounter: Payer: Self-pay | Admitting: Family Medicine

## 2024-11-04 ENCOUNTER — Encounter: Admitting: Family Medicine

## 2025-03-14 ENCOUNTER — Ambulatory Visit: Admitting: Dermatology
